# Patient Record
Sex: Male | Born: 1955
Health system: Southern US, Community
[De-identification: ages and names within clinical notes are randomized; demographics above are authoritative.]

## PROBLEM LIST (undated history)

## (undated) DIAGNOSIS — F528 Other sexual dysfunction not due to a substance or known physiological condition: Secondary | ICD-10-CM

## (undated) DIAGNOSIS — E119 Type 2 diabetes mellitus without complications: Secondary | ICD-10-CM

## (undated) DIAGNOSIS — F909 Attention-deficit hyperactivity disorder, unspecified type: Secondary | ICD-10-CM

## (undated) DIAGNOSIS — M51379 Other intervertebral disc degeneration, lumbosacral region without mention of lumbar back pain or lower extremity pain: Secondary | ICD-10-CM

## (undated) DIAGNOSIS — I10 Essential (primary) hypertension: Secondary | ICD-10-CM

## (undated) DIAGNOSIS — E785 Hyperlipidemia, unspecified: Secondary | ICD-10-CM

## (undated) DIAGNOSIS — S82899A Other fracture of unspecified lower leg, initial encounter for closed fracture: Secondary | ICD-10-CM

## (undated) DIAGNOSIS — M5137 Other intervertebral disc degeneration, lumbosacral region: Secondary | ICD-10-CM

## (undated) HISTORY — DX: Attention-deficit hyperactivity disorder, unspecified type: F90.9

## (undated) HISTORY — DX: Type 2 diabetes mellitus without complications: E11.9

## (undated) HISTORY — DX: Other fracture of unspecified lower leg, initial encounter for closed fracture: S82.899A

## (undated) HISTORY — DX: Essential (primary) hypertension: I10

## (undated) HISTORY — DX: Other intervertebral disc degeneration, lumbosacral region without mention of lumbar back pain or lower extremity pain: M51.379

## (undated) HISTORY — DX: Hyperlipidemia, unspecified: E78.5

## (undated) HISTORY — DX: Other sexual dysfunction not due to a substance or known physiological condition: F52.8

## (undated) HISTORY — DX: Other intervertebral disc degeneration, lumbosacral region: M51.37

## (undated) HISTORY — PX: OTHER SURGICAL HISTORY: SHX169

---

## 2004-11-15 ENCOUNTER — Ambulatory Visit: Payer: Self-pay | Admitting: Internal Medicine

## 2004-11-22 ENCOUNTER — Ambulatory Visit: Payer: Self-pay | Admitting: Internal Medicine

## 2005-05-23 ENCOUNTER — Ambulatory Visit: Payer: Self-pay | Admitting: Internal Medicine

## 2005-07-02 ENCOUNTER — Emergency Department (HOSPITAL_COMMUNITY): Admission: EM | Admit: 2005-07-02 | Discharge: 2005-07-02 | Payer: Self-pay | Admitting: Emergency Medicine

## 2005-08-03 ENCOUNTER — Ambulatory Visit: Payer: Self-pay | Admitting: Internal Medicine

## 2006-03-15 ENCOUNTER — Ambulatory Visit: Payer: Self-pay | Admitting: Internal Medicine

## 2006-10-04 ENCOUNTER — Encounter: Payer: Self-pay | Admitting: Internal Medicine

## 2006-10-04 DIAGNOSIS — T7840XA Allergy, unspecified, initial encounter: Secondary | ICD-10-CM | POA: Insufficient documentation

## 2006-10-04 DIAGNOSIS — S82899A Other fracture of unspecified lower leg, initial encounter for closed fracture: Secondary | ICD-10-CM | POA: Insufficient documentation

## 2006-10-04 DIAGNOSIS — M5137 Other intervertebral disc degeneration, lumbosacral region: Secondary | ICD-10-CM | POA: Insufficient documentation

## 2006-12-14 ENCOUNTER — Ambulatory Visit: Payer: Self-pay | Admitting: Internal Medicine

## 2006-12-14 LAB — CONVERTED CEMR LAB
ALT: 30 units/L (ref 0–53)
Albumin: 4.2 g/dL (ref 3.5–5.2)
Alkaline Phosphatase: 68 units/L (ref 39–117)
Chloride: 102 meq/L (ref 96–112)
Cholesterol: 272 mg/dL (ref 0–200)
Creatinine, Ser: 1.2 mg/dL (ref 0.4–1.5)
Eosinophils Absolute: 0.1 10*3/uL (ref 0.0–0.6)
Eosinophils Relative: 1.2 % (ref 0.0–5.0)
GFR calc Af Amer: 83 mL/min
Glucose, Bld: 114 mg/dL — ABNORMAL HIGH (ref 70–99)
HCT: 42.4 % (ref 39.0–52.0)
HDL: 41.2 mg/dL (ref >39.0)
Hemoglobin, Urine: NEGATIVE
Ketones, ur: NEGATIVE mg/dL
Leukocytes, UA: NEGATIVE
MCHC: 35.3 g/dL (ref 30.0–36.0)
Monocytes Relative: 5.6 % (ref 3.0–11.0)
Neutrophils Relative %: 71.3 % (ref 43.0–77.0)
PSA: 0.88 ng/mL (ref 0.10–4.00)
Platelets: 175 10*3/uL (ref 150–400)
Potassium: 4.8 meq/L (ref 3.5–5.1)
Sodium: 139 meq/L (ref 135–145)
Total Bilirubin: 1.6 mg/dL — ABNORMAL HIGH (ref 0.3–1.2)
Total CHOL/HDL Ratio: 6.6
Total Protein: 7.5 g/dL (ref 6.0–8.3)
Triglycerides: 173 mg/dL — ABNORMAL HIGH (ref 0–149)
Urine Glucose: NEGATIVE mg/dL
VLDL: 35 mg/dL (ref 0–40)
WBC: 8.2 10*3/uL (ref 4.5–10.5)

## 2006-12-19 ENCOUNTER — Ambulatory Visit: Payer: Self-pay | Admitting: Internal Medicine

## 2006-12-19 DIAGNOSIS — E785 Hyperlipidemia, unspecified: Secondary | ICD-10-CM | POA: Insufficient documentation

## 2006-12-19 DIAGNOSIS — F909 Attention-deficit hyperactivity disorder, unspecified type: Secondary | ICD-10-CM | POA: Insufficient documentation

## 2006-12-19 DIAGNOSIS — F528 Other sexual dysfunction not due to a substance or known physiological condition: Secondary | ICD-10-CM | POA: Insufficient documentation

## 2007-12-16 ENCOUNTER — Ambulatory Visit: Payer: Self-pay | Admitting: Internal Medicine

## 2007-12-17 LAB — CONVERTED CEMR LAB
Albumin: 4.1 g/dL (ref 3.5–5.2)
Alkaline Phosphatase: 71 units/L (ref 39–117)
Basophils Absolute: 0 10*3/uL (ref 0.0–0.1)
Basophils Relative: 0.3 % (ref 0.0–3.0)
Calcium: 9.2 mg/dL (ref 8.4–10.5)
Eosinophils Absolute: 0.1 10*3/uL (ref 0.0–0.7)
GFR calc non Af Amer: 75 mL/min
Hemoglobin: 14.2 g/dL (ref 13.0–17.0)
Hgb A1c MFr Bld: 5.7 % (ref 4.6–6.0)
Ketones, ur: NEGATIVE mg/dL
Leukocytes, UA: NEGATIVE
Lymphocytes Relative: 20.7 % (ref 12.0–46.0)
MCHC: 34.8 g/dL (ref 30.0–36.0)
MCV: 86.3 fL (ref 78.0–100.0)
Monocytes Absolute: 0.4 10*3/uL (ref 0.1–1.0)
Monocytes Relative: 5.1 % (ref 3.0–12.0)
Neutrophils Relative %: 72.3 % (ref 43.0–77.0)
Platelets: 141 10*3/uL — ABNORMAL LOW (ref 150–400)
Sodium: 143 meq/L (ref 135–145)
TSH: 1.5 microintl units/mL (ref 0.35–5.50)
Total Bilirubin: 1.8 mg/dL — ABNORMAL HIGH (ref 0.3–1.2)
Total CHOL/HDL Ratio: 2.9
Triglycerides: 100 mg/dL (ref 0–149)
Urine Glucose: NEGATIVE mg/dL
pH: 6 (ref 5.0–8.0)

## 2007-12-23 ENCOUNTER — Ambulatory Visit: Payer: Self-pay | Admitting: Internal Medicine

## 2008-02-26 ENCOUNTER — Ambulatory Visit: Payer: Self-pay | Admitting: Internal Medicine

## 2008-03-09 ENCOUNTER — Ambulatory Visit: Payer: Self-pay | Admitting: Gastroenterology

## 2008-03-09 LAB — HM COLONOSCOPY

## 2008-06-01 ENCOUNTER — Ambulatory Visit: Payer: Self-pay | Admitting: Internal Medicine

## 2008-06-01 DIAGNOSIS — R3915 Urgency of urination: Secondary | ICD-10-CM | POA: Insufficient documentation

## 2008-06-01 DIAGNOSIS — K645 Perianal venous thrombosis: Secondary | ICD-10-CM | POA: Insufficient documentation

## 2008-06-01 LAB — CONVERTED CEMR LAB
Bilirubin Urine: NEGATIVE
Leukocytes, UA: NEGATIVE
Nitrite: NEGATIVE
pH: 5.5 (ref 5.0–8.0)

## 2008-06-16 ENCOUNTER — Telehealth: Payer: Self-pay | Admitting: Gastroenterology

## 2008-10-05 ENCOUNTER — Ambulatory Visit: Payer: Self-pay | Admitting: Internal Medicine

## 2008-10-05 DIAGNOSIS — M549 Dorsalgia, unspecified: Secondary | ICD-10-CM | POA: Insufficient documentation

## 2008-10-05 DIAGNOSIS — G4710 Hypersomnia, unspecified: Secondary | ICD-10-CM | POA: Insufficient documentation

## 2008-10-29 ENCOUNTER — Ambulatory Visit: Payer: Self-pay | Admitting: Pulmonary Disease

## 2008-11-25 ENCOUNTER — Ambulatory Visit (HOSPITAL_BASED_OUTPATIENT_CLINIC_OR_DEPARTMENT_OTHER): Admission: RE | Admit: 2008-11-25 | Discharge: 2008-11-25 | Payer: Self-pay | Admitting: Pulmonary Disease

## 2008-11-27 ENCOUNTER — Ambulatory Visit: Payer: Self-pay | Admitting: Pulmonary Disease

## 2008-12-05 ENCOUNTER — Encounter: Payer: Self-pay | Admitting: Pulmonary Disease

## 2009-03-04 ENCOUNTER — Ambulatory Visit: Payer: Self-pay | Admitting: Internal Medicine

## 2009-03-04 LAB — CONVERTED CEMR LAB
ALT: 33 units/L (ref 0–53)
Albumin: 4.3 g/dL (ref 3.5–5.2)
Alkaline Phosphatase: 93 units/L (ref 39–117)
BUN: 7 mg/dL (ref 6–23)
Basophils Absolute: 0 10*3/uL (ref 0.0–0.1)
Calcium: 9.3 mg/dL (ref 8.4–10.5)
Creatinine, Ser: 1.1 mg/dL (ref 0.4–1.5)
GFR calc non Af Amer: 90.65 mL/min (ref >60)
Hemoglobin, Urine: NEGATIVE
Hemoglobin: 13.9 g/dL (ref 13.0–17.0)
Hgb A1c MFr Bld: 7 % — ABNORMAL HIGH (ref 4.6–6.5)
LDL Cholesterol: 118 mg/dL — ABNORMAL HIGH (ref 0–99)
Lymphs Abs: 1.5 10*3/uL (ref 0.7–4.0)
Monocytes Absolute: 0.4 10*3/uL (ref 0.1–1.0)
Monocytes Relative: 5.8 % (ref 3.0–12.0)
Nitrite: NEGATIVE
PSA: 0.81 ng/mL (ref 0.10–4.00)
Platelets: 145 10*3/uL — ABNORMAL LOW (ref 150.0–400.0)
RBC: 4.83 M/uL (ref 4.22–5.81)
Specific Gravity, Urine: 1.02 (ref 1.000–1.030)
TSH: 1.49 microintl units/mL (ref 0.35–5.50)
Total Bilirubin: 1.4 mg/dL — ABNORMAL HIGH (ref 0.3–1.2)
Total Protein: 7.4 g/dL (ref 6.0–8.3)
Triglycerides: 143 mg/dL (ref 0.0–149.0)
WBC: 7 10*3/uL (ref 4.5–10.5)
pH: 5.5 (ref 5.0–8.0)

## 2009-03-10 ENCOUNTER — Ambulatory Visit: Payer: Self-pay | Admitting: Internal Medicine

## 2009-07-22 ENCOUNTER — Ambulatory Visit: Payer: Self-pay | Admitting: Internal Medicine

## 2009-07-22 DIAGNOSIS — R21 Rash and other nonspecific skin eruption: Secondary | ICD-10-CM | POA: Insufficient documentation

## 2010-02-15 NOTE — Assessment & Plan Note (Signed)
Summary: CPX / [employer] / # / CD   Vital Signs:  Patient profile:   55 year old male Height:      69 inches Weight:      201 pounds BMI:     29.79 O2 Sat:      98 % on Room air Temp:     97.6 degrees F oral Pulse rate:   94 / minute BP sitting:   126 / 90  (left arm) Cuff size:   large  Vitals Entered ByMarland Kitchen Zella Ball Ewing (March 10, 2009 1:20 PM)  O2 Flow:  Room air  CC: Adult Physical/RE   CC:  Adult Physical/RE.  History of Present Illness: overall doing well, no complaints;  Pt denies CP, sob, doe, wheezing, orthopnea, pnd, worsening LE edema, palps, dizziness or syncope   Pt denies new neuro symptoms such as headache, facial or extremity weakness   Overall excellent med compliance, and good med tolerance.  No other new complaints  Problems Prior to Update: 1)  Preventive Health Care  (ICD-V70.0) 2)  Hypersomnia  (ICD-780.54) 3)  Back Pain  (ICD-724.5) 4)  Urinary Urgency  (ICD-788.63) 5)  External Hemorrhoids, Thrombosed  (ICD-455.4) 6)  Preventive Health Care  (ICD-V70.0) 7)  Preventive Health Care  (ICD-V70.0) 8)  Erectile Dysfunction  (ICD-302.72) 9)  Hyperlipidemia  (ICD-272.4) 10)  Adhd  (ICD-314.01) 11)  Observation For Suspected Malignant Neoplasm  (ICD-V71.1) 12)  Routine General Medical Exam@health  Care Facl  (ICD-V70.0) 13)  Fracture, Right Leg  (ICD-827.0) 14)  Disc Disease, Lumbar  (ICD-722.52) 15)  Allergy  (ICD-995.3)  Medications Prior to Update: 1)  Crestor 40 Mg Tabs (Rosuvastatin Calcium) .Marland Kitchen.. 1 By Mouth Once Daily 2)  Ritalin 20 Mg Tabs (Methylphenidate Hcl) .Marland Kitchen.. 1 By Mouth Two Times A Day; Every Morning and At Noon 3)  Depakote Er 250 Mg  Tb24 (Divalproex Sodium) .Marland Kitchen.. 1 By Mouth Qhs 4)  Ecotrin Low Strength 81 Mg  Tbec (Aspirin) .Marland Kitchen.. 1 By Mouth Qd 5)  Viagra 100 Mg  Tabs (Sildenafil Citrate) .Marland Kitchen.. 1 By Mouth Once Daily Prn 6)  Tramadol Hcl 50 Mg Tabs (Tramadol Hcl) .Marland Kitchen.. 1 - 2 By Mouth Q 6 Hrs As Needed 7)  Flexeril 5 Mg Tabs (Cyclobenzaprine Hcl)  .Marland Kitchen.. 1 By Mouth Three Times A Day As Needed  Current Medications (verified): 1)  Crestor 40 Mg Tabs (Rosuvastatin Calcium) .Marland Kitchen.. 1 By Mouth Once Daily 2)  Ritalin 20 Mg Tabs (Methylphenidate Hcl) .Marland Kitchen.. 1 By Mouth Two Times A Day; Every Morning and At Noon 3)  Depakote Er 250 Mg  Tb24 (Divalproex Sodium) .Marland Kitchen.. 1 By Mouth Qhs 4)  Ecotrin Low Strength 81 Mg  Tbec (Aspirin) .Marland Kitchen.. 1 By Mouth Qd 5)  Viagra 100 Mg  Tabs (Sildenafil Citrate) .Marland Kitchen.. 1 By Mouth Once Daily Prn  Allergies (verified): No Known Drug Allergies  Past History:  Past Medical History: Last updated: 12/19/2006 DISC DISEASE, LUMBAR (ICD-722.52) ALLERGY (ICD-995.3) ADHD Hyperlipidemia E.D.  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         all otherwise negative per pt  Physical Exam  General:  alert and overweight-appearing.   Head:  normocephalic and atraumatic.   Eyes:  vision grossly intact, pupils equal, and pupils round.   Ears:  R ear normal and L ear normal.   Nose:  no external deformity and no nasal discharge.   Mouth:  no gingival abnormalities and  pharynx pink and moist.   Neck:  supple and no masses.   Lungs:  normal respiratory effort and normal breath sounds.   Heart:  normal rate and regular rhythm.   Abdomen:  soft, non-tender, and normal bowel sounds.   Msk:  no joint tenderness and no joint swelling.   Extremities:  no edema, no erythema  Neurologic:  cranial nerves II-XII intact and strength normal in all extremities.     Impression & Recommendations:  Problem # 1:  Preventive Health Care (ICD-V70.0)  Overall doing well, age appropriate education and counseling updated and referral for appropriate preventive services done unless declined, immunizations up to date or declined, diet counseling done if overweight, urged to quit smoking if smokes , most recent labs reviewed and current ordered if appropriate, ecg reviewed or declined (interpretation per ECG scanned in the EMR if done); information regarding Medicare Prevention requirements given if appropriate   Orders: EKG w/ Interpretation (93000)  Complete Medication List: 1)  Crestor 40 Mg Tabs (Rosuvastatin calcium) .Marland Kitchen.. 1 by mouth once daily 2)  Ritalin 20 Mg Tabs (Methylphenidate hcl) .Marland Kitchen.. 1 by mouth two times a day; every morning and at noon 3)  Depakote Er 250 Mg Tb24 (Divalproex sodium) .Marland Kitchen.. 1 by mouth qhs 4)  Ecotrin Low Strength 81 Mg Tbec (Aspirin) .Marland Kitchen.. 1 by mouth qd 5)  Viagra 100 Mg Tabs (Sildenafil citrate) .Marland Kitchen.. 1 by mouth once daily prn  Other Orders: Admin 1st Vaccine (98119) Flu Vaccine 25yrs + (14782)  Patient Instructions: 1)  Take an Aspirin every day - 81 mg - 1 per day - COATED only  2)  please re-start the crestor 3)  Continue all previous medications as before this visit  4)  Please schedule a follow-up appointment in 1 year or sooner if needed Prescriptions: CRESTOR 40 MG TABS (ROSUVASTATIN CALCIUM) 1 by mouth once daily  #90 x 3   Entered and Authorized by:   Corwin Levins MD   Signed by:   Corwin Levins MD on 03/10/2009   Method used:    Electronically to        [facility]  [phone]* (retail)       [address]       [place], [place]  [address]       Ph: [phone] or [phone]       Fax: [phone]   RxID:   [id number]     Flu Vaccine Consent Questions     Do you have a history of severe allergic reactions to this vaccine? no    Any prior history of allergic reactions to egg and/or gelatin? no    Do you have a sensitivity to the preservative Thimersol? no    Do you have a past history of Guillan-Barre Syndrome? no  Do you currently have an acute febrile illness? no    Have you ever had a severe reaction to latex? no    Vaccine information given and explained to patient? yes    Are you currently pregnant? no    Lot Number:[id number]   Exp Date:07/15/2009   Site Given  Left Deltoid IMflu

## 2010-02-15 NOTE — Assessment & Plan Note (Signed)
Summary: RASH/NWS   Vital Signs:  Patient profile:   55 year old male Height:      69 inches Weight:      198.25 pounds BMI:     29.38 O2 Sat:      95 % on Room air Temp:     98.4 degrees F oral Pulse rate:   79 / minute BP sitting:   110 / 82  (left arm) Cuff size:   large  Vitals Entered By: Zella Ball Ewing CMA Duncan Dull) (July 22, 2009 2:59 PM)  O2 Flow:  Room air  CC: Rash/RE   CC:  Rash/RE.  History of Present Illness: here with acute onset pruritic rash seemingly to spread very quickly form the arms to the upper chest and back, after contact exposure to duaghter with similar rash last evening.  No fever, night sweats, and Pt denies CP, sob, doe, wheezing, orthopnea, pnd, worsening LE edema, palps, dizziness or syncope  Pt denies new neuro symptoms such as headache, facial or extremity weakness   Problems Prior to Update: 1)  Rash-nonvesicular  (ICD-782.1) 2)  Preventive Health Care  (ICD-V70.0) 3)  Hypersomnia  (ICD-780.54) 4)  Back Pain  (ICD-724.5) 5)  Urinary Urgency  (ICD-788.63) 6)  External Hemorrhoids, Thrombosed  (ICD-455.4) 7)  Preventive Health Care  (ICD-V70.0) 8)  Preventive Health Care  (ICD-V70.0) 9)  Erectile Dysfunction  (ICD-302.72) 10)  Hyperlipidemia  (ICD-272.4) 11)  Adhd  (ICD-314.01) 12)  Observation For Suspected Malignant Neoplasm  (ICD-V71.1) 13)  Routine General Medical Exam@health  Care Facl  (ICD-V70.0) 14)  Fracture, Right Leg  (ICD-827.0) 15)  Disc Disease, Lumbar  (ICD-722.52) 16)  Allergy  (ICD-995.3)  Medications Prior to Update: 1)  Crestor 40 Mg Tabs (Rosuvastatin Calcium) .Marland Kitchen.. 1 By Mouth Once Daily 2)  Ritalin 20 Mg Tabs (Methylphenidate Hcl) .Marland Kitchen.. 1 By Mouth Two Times A Day; Every Morning and At Noon 3)  Depakote Er 250 Mg  Tb24 (Divalproex Sodium) .Marland Kitchen.. 1 By Mouth Qhs 4)  Ecotrin Low Strength 81 Mg  Tbec (Aspirin) .Marland Kitchen.. 1 By Mouth Qd 5)  Viagra 100 Mg  Tabs (Sildenafil Citrate) .Marland Kitchen.. 1 By Mouth Once Daily Prn  Current Medications  (verified): 1)  Crestor 40 Mg Tabs (Rosuvastatin Calcium) .Marland Kitchen.. 1 By Mouth Once Daily 2)  Ritalin 20 Mg Tabs (Methylphenidate Hcl) .Marland Kitchen.. 1 By Mouth Two Times A Day; Every Morning and At Noon 3)  Depakote Er 250 Mg  Tb24 (Divalproex Sodium) .Marland Kitchen.. 1 By Mouth Qhs 4)  Ecotrin Low Strength 81 Mg  Tbec (Aspirin) .Marland Kitchen.. 1 By Mouth Qd 5)  Viagra 100 Mg  Tabs (Sildenafil Citrate) .Marland Kitchen.. 1 By Mouth Once Daily Prn 6)  Prednisone 10 Mg Tabs (Prednisone) .... 3po Qd For 3days, Then 2po Qd For 3days, Then 1po Qd For 3days, Then Stop 7)  Triamcinolone Acetonide 0.1 % Crea (Triamcinolone Acetonide) .... Use Asd Two Times A Day As Needed  Allergies (verified): No Known Drug Allergies  Past History:  Past Medical History: Last updated: 12/19/2006 DISC DISEASE, LUMBAR (ICD-722.52) ALLERGY (ICD-995.3) ADHD Hyperlipidemia E.D.  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   Head:  normocephalic and atraumatic.   Eyes:  vision grossly intact, pupils equal, and pupils round.   Ears:  R ear normal and L ear normal.   Nose:  no external deformity and no nasal discharge.   Mouth:  no gingival abnormalities and pharynx pink and moist.   Neck:  supple and no masses.   Lungs:  normal respiratory effort and normal breath sounds.   Heart:  normal rate and regular rhythm.   Extremities:  no edema, no erythema  Skin:  muliple areas to arms, upper back and chest with rashc/w contact derm   Problems:  Medical Problems Added: 1)  Dx of Rash-nonvesicular  (ICD-782.1)  Impression & Recommendations:  Problem # 1:   RASH-NONVESICULAR (ICD-782.1)  His updated medication list for this problem includes:    Triamcinolone Acetonide 0.1 % Crea (Triamcinolone acetonide) ..... Use asd two times a day as needed  Orders: Depo- Medrol 40mg  (J1030) Depo- Medrol 80mg  (J1040) Admin of Therapeutic Inj  intramuscular or subcutaneous (64403) c/w contact dermatitis - tx with depo IM todya, prednisone pack and treat as above, f/u any worsening signs or symptoms   Complete Medication List: 1)  Crestor 40 Mg Tabs (Rosuvastatin calcium) .Marland Kitchen.. 1 by mouth once daily 2)  Ritalin 20 Mg Tabs (Methylphenidate hcl) .Marland Kitchen.. 1 by mouth two times a day; every morning and at noon 3)  Depakote Er 250 Mg Tb24 (Divalproex sodium) .Marland Kitchen.. 1 by mouth qhs 4)  Ecotrin Low Strength 81 Mg Tbec (Aspirin) .Marland Kitchen.. 1 by mouth qd 5)  Viagra 100 Mg Tabs (Sildenafil citrate) .Marland Kitchen.. 1 by mouth once daily prn 6)  Prednisone 10 Mg Tabs (Prednisone) .... 3po qd for 3days, then 2po qd for 3days, then 1po qd for 3days, then stop 7)  Triamcinolone Acetonide 0.1 % Crea (Triamcinolone acetonide) .... Use asd two times a day as needed  Patient Instructions: 1)  you had the steroid shot today 2)  Please take all new medications as prescribed - the prednsone and cream 3)  Continue all previous medications as before this visit  4)  Please schedule a follow-up appointment in Feb 2012 with CPX labs Prescriptions: TRIAMCINOLONE ACETONIDE 0.1 % CREA (TRIAMCINOLONE ACETONIDE) use asd two times a day as needed  #1 x 1   Entered and Authorized by:   Corwin Levins MD   Signed by:   Corwin Levins MD on 07/22/2009   Method used:   Print then Give to Patient   RxID:   [id number] PREDNISONE 10 MG TABS (PREDNISONE) 3po qd for 3days, then 2po qd for 3days, then 1po qd for 3days, then stop  #18 x 0   Entered and Authorized by:   Corwin Levins MD   Signed by:   Corwin Levins MD on 07/22/2009   Method used:   Print then Give to Patient   RxID:   [id number]    Medication  Administration  Injection # 1:    Medication: Depo- Medrol 40mg     Diagnosis: RASH-NONVESICULAR (ICD-782.1)    Route: IM    Site: LUOQ gluteus    Exp Date: 04/2012    Lot #: [id number]    Mfr: Pharmacia    Given by: Zella Ball Ewing CMA (AAMA) (July 22, 2009 4:08 PM)  Injection # 2:    Medication: Depo- Medrol 80mg     Diagnosis: RASH-NONVESICULAR (ICD-782.1)    Route: IM    Site: LUOQ gluteus  Exp Date: 04/2012    Lot #: [id number]    Mfr: Pharmacia    Given by: Zella Ball Ewing CMA Duncan Dull) (July 22, 2009 4:08 PM)  Orders Added: 1)  Depo- Medrol 40mg  [J1030] 2)  Depo- Medrol 80mg  [J1040] 3)  Admin of Therapeutic Inj  intramuscular or subcutaneous [96372] 4)  Est. Patient Level III [52841]

## 2010-03-18 ENCOUNTER — Encounter: Payer: Self-pay | Admitting: Internal Medicine

## 2010-03-18 ENCOUNTER — Ambulatory Visit (INDEPENDENT_AMBULATORY_CARE_PROVIDER_SITE_OTHER): Payer: BC Managed Care – PPO | Admitting: Internal Medicine

## 2010-03-18 ENCOUNTER — Other Ambulatory Visit: Payer: Self-pay | Admitting: Internal Medicine

## 2010-03-18 DIAGNOSIS — J019 Acute sinusitis, unspecified: Secondary | ICD-10-CM | POA: Insufficient documentation

## 2010-03-18 DIAGNOSIS — F528 Other sexual dysfunction not due to a substance or known physiological condition: Secondary | ICD-10-CM

## 2010-03-18 DIAGNOSIS — T7840XA Allergy, unspecified, initial encounter: Secondary | ICD-10-CM

## 2010-03-21 ENCOUNTER — Ambulatory Visit (INDEPENDENT_AMBULATORY_CARE_PROVIDER_SITE_OTHER)
Admission: RE | Admit: 2010-03-21 | Discharge: 2010-03-21 | Disposition: A | Discharge status: Home / Self Care | Payer: BC Managed Care – PPO | Source: Ambulatory Visit | Attending: Internal Medicine | Admitting: Internal Medicine

## 2010-03-21 DIAGNOSIS — J019 Acute sinusitis, unspecified: Secondary | ICD-10-CM

## 2010-03-24 NOTE — Assessment & Plan Note (Signed)
Summary: pain and swelling in face/difficulty talking/pain started in ...   Vital Signs:  Patient profile:   55 year old male Height:      69 inches Weight:      195.38 pounds BMI:     28.96 O2 Sat:      97 % on Room air Temp:     98.8 degrees F oral Pulse rate:   73 / minute BP sitting:   152 / 100  (left arm) Cuff size:   large  Vitals Entered By: Zella Ball Ewing CMA Duncan Dull) (March 18, 2010 1:04 PM)  O2 Flow:  Room air CC: Sharp left ear and jaw pain, swelling, cough and sneezing/RE   CC:  Sharp left ear and jaw pain, swelling, and cough and sneezing/RE.  History of Present Illness: here with wife, c/o 3 days radialy worsening left ear pain and fullness, and left facial pain/sweling with  pressure, fever and greenish d/c with moderate HA and ST,  but no cough and Pt denies CP, worsening sob, doe, wheezing, orthopnea, pnd, worsening LE edema, palps, dizziness or syncope  Pt denies new neuro symptoms such as facial or extremity weakness   Pt denies polydipsia, polyuria  Overall good compliance with meds, trying to follow low chol  diet, wt stable, little excercise however  .  Ibuprofen 800 mg, trmadol, anbesol and alleve no help.  Nothing makes worse except lying on left side at night.  No prior hx of this severity, but has had ongoing allergic nasal symtpoms for several months without pain or colored d/c.  Overall good compliance with meds, and good tolerability.   Still with ED symptoms ongoing , no worse but viagra works well.    Problems Prior to Update: 1)  Sinusitis, Acute  (ICD-461.9) 2)  Rash-nonvesicular  (ICD-782.1) 3)  Preventive Health Care  (ICD-V70.0) 4)  Hypersomnia  (ICD-780.54) 5)  Back Pain  (ICD-724.5) 6)  Urinary Urgency  (ICD-788.63) 7)  External Hemorrhoids, Thrombosed  (ICD-455.4) 8)  Preventive Health Care  (ICD-V70.0) 9)  Preventive Health Care  (ICD-V70.0) 10)  Erectile Dysfunction  (ICD-302.72) 11)  Hyperlipidemia  (ICD-272.4) 12)  Adhd  (ICD-314.01) 13)   Observation For Suspected Malignant Neoplasm  (ICD-V71.1) 14)  Routine General Medical Exam@health  Care Facl  (ICD-V70.0) 15)  Fracture, Right Leg  (ICD-827.0) 16)  Disc Disease, Lumbar  (ICD-722.52) 17)  Allergy  (ICD-995.3)  Medications Prior to Update: 1)  Crestor 40 Mg Tabs (Rosuvastatin Calcium) .Marland Kitchen.. 1 By Mouth Once Daily 2)  Ritalin 20 Mg Tabs (Methylphenidate Hcl) .Marland Kitchen.. 1 By Mouth Two Times A Day; Every Morning and At Noon 3)  Depakote Er 250 Mg  Tb24 (Divalproex Sodium) .Marland Kitchen.. 1 By Mouth Qhs 4)  Ecotrin Low Strength 81 Mg  Tbec (Aspirin) .Marland Kitchen.. 1 By Mouth Qd 5)  Viagra 100 Mg  Tabs (Sildenafil Citrate) .Marland Kitchen.. 1 By Mouth Once Daily Prn 6)  Prednisone 10 Mg Tabs (Prednisone) .... 3po Qd For 3days, Then 2po Qd For 3days, Then 1po Qd For 3days, Then Stop 7)  Triamcinolone Acetonide 0.1 % Crea (Triamcinolone Acetonide) .... Use Asd Two Times A Day As Needed  Current Medications (verified): 1)  Crestor 40 Mg Tabs (Rosuvastatin Calcium) .Marland Kitchen.. 1 By Mouth Once Daily 2)  Ritalin 20 Mg Tabs (Methylphenidate Hcl) .Marland Kitchen.. 1 By Mouth Two Times A Day; Every Morning and At Noon 3)  Depakote Er 250 Mg  Tb24 (Divalproex Sodium) .Marland Kitchen.. 1 By Mouth Qhs 4)  Ecotrin Low Strength 81 Mg  Tbec (Aspirin) .Marland Kitchen.. 1 By Mouth Qd 5)  Viagra 100 Mg  Tabs (Sildenafil Citrate) .Marland Kitchen.. 1 By Mouth Once Daily Prn 6)  Amoxicillin-Pot Clavulanate 875-125 Mg Tabs (Amoxicillin-Pot Clavulanate) .Marland Kitchen.. 1 By Mouth Two Times A Day 7)  Oxycodone Hcl 5 Mg Tabs (Oxycodone Hcl) .Marland Kitchen.. 1 By Mouth Q 6 Hrs As Needed Pain  Allergies (verified): No Known Drug Allergies  Past History:  Past Medical History: Last updated: 12/19/2006 DISC DISEASE, LUMBAR (ICD-722.52) ALLERGY (ICD-995.3) ADHD Hyperlipidemia E.D.  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   Eyes:  vision grossly intact, pupils equal, and pupils round.   Ears:  left tm moderate erythema, right tm ok, canals clear, left facial and maxillary sinus area severe tender, swelling Nose:  nasal dischargemucosal pallor.   Mouth:  no gingival abnormalities and pharynx pink and moist.   Neck:  supple and cervical lymphadenopathy on left submandibular only Lungs:  normal respiratory effort and normal breath sounds.   Heart:  normal rate and regular rhythm.   Extremities:  no edema, no erythema    Impression & Recommendations:  Problem # 1:  SINUSITIS, ACUTE (ICD-461.9)  unusual case , left maxillary only with severe swelling and severe pain;  for demerol/phenergan 25/25 mg IM today, rocephin IM, augmentin for home and pain med,  CT sinus today, and consider ENT evaluation - ? left max sinus obstruction  His updated medication list for this problem includes:    Amoxicillin-pot Clavulanate 875-125 Mg Tabs (Amoxicillin-pot clavulanate) .Marland Kitchen... 1 by mouth two times a day  Orders: Rocephin  250mg  (Z6109) Demerol / Phenergan Injection (U0454) Admin of Therapeutic Inj  intramuscular or subcutaneous (09811) Admin of Therapeutic Inj  intramuscular or subcutaneous (91478) Radiology Referral (Radiology)  Problem # 2:  ALLERGY (ICD-995.3) treat as above, f/u any worsening signs or symptoms  - after to start OTC allegra as needed   Problem # 3:  ERECTILE DYSFUNCTION (ICD-302.72)  His updated medication list for this problem includes:    Viagra 100 Mg Tabs (Sildenafil citrate) .Marland Kitchen... 1 by mouth once daily prn stable overall by hx and exam, ok to continue meds/tx as is  - refills today  Complete Medication List: 1)  Crestor 40 Mg Tabs (Rosuvastatin  calcium) .Marland Kitchen.. 1 by mouth once daily 2)  Ritalin 20 Mg Tabs (Methylphenidate hcl) .Marland Kitchen.. 1 by mouth two times a day; every morning and at noon 3)  Depakote Er 250 Mg Tb24 (Divalproex sodium) .Marland Kitchen.. 1 by mouth qhs 4)  Ecotrin Low Strength 81 Mg Tbec (Aspirin) .Marland Kitchen.. 1 by mouth qd 5)  Viagra 100 Mg Tabs (Sildenafil citrate) .Marland Kitchen.. 1 by mouth once daily prn 6)  Amoxicillin-pot Clavulanate 875-125 Mg Tabs (Amoxicillin-pot clavulanate) .Marland Kitchen.. 1 by mouth two times a day 7)  Oxycodone Hcl 5 Mg Tabs (Oxycodone hcl) .Marland Kitchen.. 1 by mouth q 6 hrs as needed pain  Patient Instructions: 1)  you had the pain shot (demerol/phenergan 25/25 mg) today 2)  you had the antibiotic shot today (rocephin) 3)  Please take all new medications  as prescribed - the antibiotic and pain med for home 4)  You can also use Allegra OTC or it's generic for congestion and allergies  5)  Please see the PCC's now to hopefully get the sinus CT today 6)  You may need to see ENT depending on the CT results 7)  Continue all previous medications as before this visit  8)  Please schedule a follow-up appointment in 1 month for CPX with labs and: 9)  depakote level:  314.01 Prescriptions: OXYCODONE HCL 5 MG TABS (OXYCODONE HCL) 1 by mouth q 6 hrs as needed pain  #30 x 0   Entered and Authorized by:   Corwin Levins MD   Signed by:   Corwin Levins MD on 03/18/2010   Method used:   Print then Give to Patient   RxID:   [id number] AMOXICILLIN-POT CLAVULANATE 875-125 MG TABS (AMOXICILLIN-POT CLAVULANATE) 1 by mouth two times a day  #20 x 0   Entered and Authorized by:   Corwin Levins MD   Signed by:   Corwin Levins MD on 03/18/2010   Method used:   Print then Give to Patient   RxID:   [id number]    Medication Administration  Injection # 1:    Medication: Rocephin  250mg     Diagnosis: SINUSITIS, ACUTE (ICD-461.9)    Route: IM    Site: RUOQ gluteus    Exp Date: 02/2013    Lot #: [id number]    Mfr: NovaPlus    Comments: Patient received  Rocephin 1 gm    Patient tolerated injection without complications    Given by: Zella Ball Ewing CMA (AAMA) (March 18, 2010 2:02 PM)  Injection # 2:    Medication: Demerol / Phenergan Injection    Diagnosis: SINUSITIS, ACUTE (ICD-461.9)    Route: IM    Site: LUOQ gluteus    Exp Date: 11/17/2010    Lot #: [id number]    Mfr: Hospira    Comments: Patient received Demerol 25mg   (Demerol 50mg  vial used, only injected 25mg , discarded the other half) and Promethazine 25mg , Lot #[id number], exp. 06/2011, Mfg. NovaPlus.    Patient tolerated injection without complications    Given by: Zella Ball Ewing CMA Duncan Dull) (March 18, 2010 2:07 PM)  Orders Added: 1)  Rocephin  250mg  [J0696] 2)  Demerol / Phenergan Injection [J2180] 3)  Admin of Therapeutic Inj  intramuscular or subcutaneous [96372] 4)  Admin of Therapeutic Inj  intramuscular or subcutaneous [96372] 5)  Radiology Referral [Radiology] 6)  Est. Patient Level IV [32440]

## 2010-04-07 ENCOUNTER — Other Ambulatory Visit: Payer: Self-pay | Admitting: Internal Medicine

## 2010-04-08 ENCOUNTER — Other Ambulatory Visit: Payer: Self-pay

## 2010-04-08 NOTE — Telephone Encounter (Signed)
Needs OV.  

## 2010-04-08 NOTE — Telephone Encounter (Signed)
Needs OV per office protocol - we normally dont prescribe antibx without OV

## 2010-04-08 NOTE — Telephone Encounter (Signed)
Pt advised and states he will use OTC meds and make OV if needed.

## 2010-04-08 NOTE — Telephone Encounter (Signed)
CT sinus was completely normal; I can refer to ENT for sinus (quicker) , or neurology for HA if he wants, o/w if no fever should not need further antibx; can try mucinex otc prn as well, or even allegra if having clearish drainage that could be allergic

## 2010-04-08 NOTE — Telephone Encounter (Signed)
A user error has taken place: encounter opened in error, closed for administrative reasons.

## 2010-04-08 NOTE — Telephone Encounter (Signed)
Pt states that sinus infection has not resolves, still experiencing ear pain and drainage.

## 2010-04-21 ENCOUNTER — Other Ambulatory Visit: Payer: Self-pay

## 2010-04-25 ENCOUNTER — Other Ambulatory Visit (INDEPENDENT_AMBULATORY_CARE_PROVIDER_SITE_OTHER): Payer: BC Managed Care – PPO

## 2010-04-25 DIAGNOSIS — Z0000 Encounter for general adult medical examination without abnormal findings: Secondary | ICD-10-CM

## 2010-04-25 DIAGNOSIS — Z1289 Encounter for screening for malignant neoplasm of other sites: Secondary | ICD-10-CM

## 2010-04-25 LAB — HEPATIC FUNCTION PANEL
ALT: 25 U/L (ref 0–53)
AST: 23 U/L (ref 0–37)
Alkaline Phosphatase: 80 U/L (ref 39–117)
Bilirubin, Direct: 0.2 mg/dL (ref 0.0–0.3)
Total Protein: 7.4 g/dL (ref 6.0–8.3)

## 2010-04-25 LAB — LIPID PANEL
Cholesterol: 158 mg/dL (ref 0–200)
HDL: 46.9 mg/dL (ref >39.00)
LDL Cholesterol: 86 mg/dL (ref 0–99)
Triglycerides: 128 mg/dL (ref 0.0–149.0)
VLDL: 25.6 mg/dL (ref 0.0–40.0)

## 2010-04-25 LAB — URINALYSIS
Hgb urine dipstick: NEGATIVE
Ketones, ur: NEGATIVE
Nitrite: NEGATIVE
Total Protein, Urine: NEGATIVE
Urobilinogen, UA: 0.2 (ref 0.0–1.0)

## 2010-04-25 LAB — BASIC METABOLIC PANEL
CO2: 28 mEq/L (ref 19–32)
Creatinine, Ser: 1.2 mg/dL (ref 0.4–1.5)
GFR: 84.88 mL/min (ref >60.00)
Potassium: 4.1 mEq/L (ref 3.5–5.1)

## 2010-04-25 LAB — CBC WITH DIFFERENTIAL/PLATELET
Hemoglobin: 13.8 g/dL (ref 13.0–17.0)
Lymphs Abs: 1.4 10*3/uL (ref 0.7–4.0)
Neutrophils Relative %: 76.6 % (ref 43.0–77.0)

## 2010-04-25 LAB — PSA: PSA: 1.09 ng/mL (ref 0.10–4.00)

## 2010-04-28 ENCOUNTER — Ambulatory Visit (INDEPENDENT_AMBULATORY_CARE_PROVIDER_SITE_OTHER): Payer: BC Managed Care – PPO | Admitting: Internal Medicine

## 2010-04-28 ENCOUNTER — Other Ambulatory Visit (INDEPENDENT_AMBULATORY_CARE_PROVIDER_SITE_OTHER): Payer: BC Managed Care – PPO

## 2010-04-28 ENCOUNTER — Encounter: Payer: Self-pay | Admitting: Internal Medicine

## 2010-04-28 DIAGNOSIS — Z0000 Encounter for general adult medical examination without abnormal findings: Secondary | ICD-10-CM

## 2010-04-28 DIAGNOSIS — E119 Type 2 diabetes mellitus without complications: Secondary | ICD-10-CM

## 2010-04-28 DIAGNOSIS — Z0001 Encounter for general adult medical examination with abnormal findings: Secondary | ICD-10-CM | POA: Insufficient documentation

## 2010-04-28 DIAGNOSIS — F909 Attention-deficit hyperactivity disorder, unspecified type: Secondary | ICD-10-CM

## 2010-04-28 NOTE — Assessment & Plan Note (Signed)

## 2010-04-28 NOTE — Patient Instructions (Signed)
Please go to LAB in the Basement for the blood and/or urine tests to be done today Please call the number on the Blue [facility] (the [employer] [facility]) for results of testing in 2-3 days Continue all other medications as before Please return in 6 mo with Lab testing done 3-5 days before

## 2010-04-28 NOTE — Progress Notes (Signed)
Subjective:    Patient ID: Jonathan Heath, male    DOB: 06-Mar-1955, 55 y.o.   MRN: [id number]  HPI Here for wellness and f/u;  Overall doing ok;  Pt denies CP, worsening SOB, DOE, wheezing, orthopnea, PND, worsening LE edema, palpitations, dizziness or syncope.  Pt denies neurological change such as new Headache, facial or extremity weakness.  Pt denies polydipsia, polyuria, or low sugar symptoms. Pt states overall good compliance with treatment and medications, good tolerability, and trying to follow lower cholesterol diet.  Pt denies worsening depressive symptoms, suicidal ideation or panic. No fever, wt loss, night sweats, loss of appetite, or other constitutional symptoms.  Pt states good ability with ADL's, low fall risk, home safety reviewed and adequate, no significant changes in hearing or vision, and occasionally active with exercise.  Has some occasional nasal allergy congestion and pressure to left max sinus area but better with otc sudafed which he prefers. 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 Past Medical History  Diagnosis Date  . HYPERLIPIDEMIA 12/19/2006  . ERECTILE DYSFUNCTION 12/19/2006  . ADHD 12/19/2006  . EXTERNAL HEMORRHOIDS, THROMBOSED 06/01/2008  . DISC DISEASE, LUMBAR 10/04/2006  . BACK PAIN 10/05/2008  . HYPERSOMNIA 10/05/2008  . RASH-NONVESICULAR 07/22/2009  . URINARY URGENCY 06/01/2008  . FRACTURE, RIGHT LEG 10/04/2006  . ALLERGY 10/04/2006  . SINUSITIS, ACUTE 03/18/2010  . DM2 (diabetes mellitus, type 2) 04/28/2010   Past Surgical History  Procedure Date  . Hx fx right leg s/p orif     reports that he has never smoked. He does not have any smokeless tobacco history on file. He reports that he drinks alcohol. His drug history not on file. family history includes Benign prostatic hyperplasia in his father; Heart disease in his father; and Hypertension in his other. No Known Allergies Current Outpatient  Prescriptions on File Prior to Visit  Medication Sig Dispense Refill  . aspirin 81 MG EC tablet Take 81 mg by mouth daily.        . divalproex (DEPAKOTE) 250 MG 24 hr tablet Take 250 mg by mouth daily.        . methylphenidate (RITALIN) 20 MG tablet Take 20 mg by mouth. 1 by mouth two times a day every morning and at noon       . rosuvastatin (CRESTOR) 40 MG tablet Take 40 mg by mouth daily.        Marland Kitchen triamcinolone (KENALOG) 0.1 % cream Apply topically 2 (two) times daily.        . sildenafil (VIAGRA) 100 MG tablet Take 100 mg by mouth daily as needed.         Review of Systems Review of Systems  Constitutional: Negative for diaphoresis, activity change, appetite change and unexpected weight change.  HENT: Negative for hearing loss, ear pain, facial swelling, mouth sores and neck stiffness.   Eyes: Negative for pain, redness and visual disturbance.  Respiratory: Negative for shortness of breath and wheezing.   Cardiovascular: Negative for chest pain and palpitations.  Gastrointestinal: Negative for diarrhea, blood in stool, abdominal distention and rectal pain.  Genitourinary: Negative for hematuria, flank pain and decreased urine volume.  Musculoskeletal: Negative for myalgias and joint swelling.  Skin: Negative for color change and wound.  Neurological: Negative for syncope and numbness.  Hematological: Negative for adenopathy.  Psychiatric/Behavioral: Negative for hallucinations, self-injury, decreased concentration and agitation.      Objective:   Physical ExamBP  120/80  Pulse 92  Temp(Src) 98.8 F (37.1 C) (Oral)  Ht 5\' 9"  (1.753 m)  Wt 196 lb 2 oz (88.962 kg)  BMI 28.96 kg/m2  SpO2 95% Physical Exam  VS noted Constitutional: Pt is oriented to person, place, and time. Appears well-developed and well-nourished.  HENT:  Head: Normocephalic and atraumatic.  Right Ear: External ear normal.  Left Ear: External ear normal.  Nose: Nose normal.  Mouth/Throat: Oropharynx is clear  and moist.  Eyes: Conjunctivae and EOM are normal. Pupils are equal, round, and reactive to light.  Neck: Normal range of motion. Neck supple. No JVD present. No tracheal deviation present.  Cardiovascular: Normal rate, regular rhythm, normal heart sounds and intact distal pulses.   Pulmonary/Chest: Effort normal and breath sounds normal.  Abdominal: Soft. Bowel sounds are normal. There is no tenderness.  Musculoskeletal: Normal range of motion. Exhibits no edema.  Lymphadenopathy:  Has no cervical adenopathy.  Neurological: Pt is alert and oriented to person, place, and time. Pt has normal reflexes. No cranial nerve deficit.  Skin: Skin is warm and dry. No rash noted.  Psychiatric:  Has  normal mood and affect. Behavior is normal.          Assessment & Plan:

## 2010-04-28 NOTE — Assessment & Plan Note (Signed)
stable overall by hx and exam, most recent lab reviewed with pt, and pt to continue medical treatment as before, to check a1c today - consider metformin for a1c > 7.0  Lab Results  Component Value Date   HGBA1C 7.0* 03/04/2009

## 2010-04-29 ENCOUNTER — Other Ambulatory Visit: Payer: Self-pay

## 2010-04-29 MED ORDER — METFORMIN HCL ER 500 MG PO TB24: 500.0000 mg | ORAL_TABLET | Freq: Every day | ORAL | Status: DC

## 2010-05-04 ENCOUNTER — Other Ambulatory Visit: Payer: Self-pay | Admitting: Internal Medicine

## 2010-07-07 ENCOUNTER — Other Ambulatory Visit: Payer: Self-pay | Admitting: Internal Medicine

## 2010-07-22 ENCOUNTER — Telehealth: Payer: Self-pay

## 2010-07-22 MED ORDER — METFORMIN HCL ER 500 MG PO TB24: 500.0000 mg | ORAL_TABLET | Freq: Every day | ORAL | Status: DC

## 2010-07-22 NOTE — Telephone Encounter (Signed)
Pt called requesting a 90 day generic alternative to Crestor due to cost.

## 2010-07-22 NOTE — Telephone Encounter (Signed)
Ok to change to lipitor 80 mg per day  - to robin to handle as per routine refill

## 2010-07-25 ENCOUNTER — Other Ambulatory Visit: Payer: Self-pay

## 2010-07-25 MED ORDER — ATORVASTATIN CALCIUM 80 MG PO TABS: 80.0000 mg | ORAL_TABLET | Freq: Every day | ORAL | Status: DC

## 2010-10-27 ENCOUNTER — Encounter: Payer: Self-pay | Admitting: Internal Medicine

## 2010-10-27 ENCOUNTER — Ambulatory Visit (INDEPENDENT_AMBULATORY_CARE_PROVIDER_SITE_OTHER): Payer: BC Managed Care – PPO | Admitting: Internal Medicine

## 2010-10-27 VITALS — BP 112/80 | HR 93 | Temp 98.6°F | Ht 69.0 in | Wt 193.2 lb

## 2010-10-27 DIAGNOSIS — J209 Acute bronchitis, unspecified: Secondary | ICD-10-CM

## 2010-10-27 DIAGNOSIS — E785 Hyperlipidemia, unspecified: Secondary | ICD-10-CM

## 2010-10-27 DIAGNOSIS — E119 Type 2 diabetes mellitus without complications: Secondary | ICD-10-CM

## 2010-10-27 DIAGNOSIS — Z0000 Encounter for general adult medical examination without abnormal findings: Secondary | ICD-10-CM

## 2010-10-27 MED ORDER — HYDROCODONE-HOMATROPINE 5-1.5 MG/5ML PO SYRP: 5.0000 mL | ORAL_SOLUTION | Freq: Four times a day (QID) | ORAL | Status: AC | PRN

## 2010-10-27 MED ORDER — LEVOFLOXACIN 500 MG PO TABS: 500.0000 mg | ORAL_TABLET | Freq: Every day | ORAL | Status: DC

## 2010-10-27 MED ORDER — ROSUVASTATIN CALCIUM 40 MG PO TABS: 40.0000 mg | ORAL_TABLET | Freq: Every day | ORAL | Status: DC

## 2010-10-27 MED ORDER — METFORMIN HCL ER 500 MG PO TB24: 500.0000 mg | ORAL_TABLET | Freq: Every day | ORAL | Status: DC

## 2010-10-27 NOTE — Patient Instructions (Addendum)
Take all new medications as prescribed - the antibiotic (sent to the pharmacy) and the cough medicine (hardcopy) Continue all other medications as before (the crestor and metformin were sent to the pharmacy) You can also take Mucinex (or it's generic off brand) for congestion Please go to LAB in the Basement for the blood and/or urine tests to be done today Please call the phone number [phone] (the PhoneTree System) for results of testing in 2-3 days;  When calling, simply dial the number, and when prompted enter the MRN number above (the Medical Record Number) and the # key, then the message should start. Please return in 6 mo with Lab testing done 3-5 days before

## 2010-10-27 NOTE — Assessment & Plan Note (Signed)
stable overall by hx and exam, most recent data reviewed with pt, and pt to continue medical treatment as before  Lab Results  Component Value Date   HGBA1C 7.3* 04/28/2010   To re-check lab today

## 2010-10-27 NOTE — Assessment & Plan Note (Signed)
stable overall by hx and exam, most recent data reviewed with pt, and pt to continue medical treatment as before  Lab Results  Component Value Date   LDLCALC 86 04/25/2010

## 2010-10-27 NOTE — Assessment & Plan Note (Signed)
Mild to mod, for antibx course,  to f/u any worsening symptoms or concerns 

## 2010-10-27 NOTE — Progress Notes (Signed)
Subjective:    Patient ID: Jonathan Heath, male    DOB: 08-Sep-1955, 55 y.o.   MRN: [id number]  HPI  Here with acute onset mild to mod 2-3 days ST, HA, general weakness and malaise, with prod cough greenish sputum, but Pt denies chest pain, increased sob or doe, wheezing, orthopnea, PND, increased LE swelling, palpitations, dizziness or syncope.  Here to f/u; overall doing ok,  Pt denies chest pain, increased sob or doe, wheezing, orthopnea, PND, increased LE swelling, palpitations, dizziness or syncope.  Pt denies new neurological symptoms such as new headache, or facial or extremity weakness or numbness   Pt denies polydipsia, polyuria, or low sugar symptoms such as weakness or confusion improved with po intake.  Pt states overall good compliance with meds, trying to follow lower cholesterol, diabetic diet, wt overall stable but little exercise however. Past Medical History  Diagnosis Date  . HYPERLIPIDEMIA 12/19/2006  . ERECTILE DYSFUNCTION 12/19/2006  . ADHD 12/19/2006  . EXTERNAL HEMORRHOIDS, THROMBOSED 06/01/2008  . DISC DISEASE, LUMBAR 10/04/2006  . BACK PAIN 10/05/2008  . HYPERSOMNIA 10/05/2008  . RASH-NONVESICULAR 07/22/2009  . URINARY URGENCY 06/01/2008  . FRACTURE, RIGHT LEG 10/04/2006  . ALLERGY 10/04/2006  . SINUSITIS, ACUTE 03/18/2010  . DM2 (diabetes mellitus, type 2) 04/28/2010   Past Surgical History  Procedure Date  . Hx fx right leg s/p orif     reports that he has never smoked. He does not have any smokeless tobacco history on file. He reports that he drinks alcohol. His drug history not on file. family history includes Benign prostatic hyperplasia in his father; Heart disease in his father; and Hypertension in his other. No Known Allergies Current Outpatient Prescriptions on File Prior to Visit  Medication Sig Dispense Refill  . aspirin 81 MG EC tablet Take 81 mg by mouth daily.        . divalproex (DEPAKOTE) 250 MG 24 hr tablet Take 250 mg by mouth daily.        .  methylphenidate (RITALIN) 20 MG tablet Take 20 mg by mouth. 1 by mouth two times a day every morning and at noon       . triamcinolone (KENALOG) 0.1 % cream Apply topically 2 (two) times daily.         Review of Systems Review of Systems  Constitutional: Negative for diaphoresis and unexpected weight change.  HENT: Negative for drooling and tinnitus.   Eyes: Negative for photophobia and visual disturbance.  Respiratory: Negative for choking and stridor.   Gastrointestinal: Negative for vomiting and blood in stool.  Genitourinary: Negative for hematuria and decreased urine volume.     Objective:   Physical Exam BP 112/80  Pulse 93  Temp(Src) 98.6 F (37 C) (Oral)  Ht 5\' 9"  (1.753 m)  Wt 193 lb 4 oz (87.658 kg)  BMI 28.54 kg/m2  SpO2 98% Physical Exam  VS noted, mild ill Constitutional: Pt appears well-developed and well-nourished.  HENT: Head: Normocephalic.  Right Ear: External ear normal.  Left Ear: External ear normal.  Bilat tm's mild erythema.  Sinus nontender.  Pharynx mild erythema Eyes: Conjunctivae and EOM are normal. Pupils are equal, round, and reactive to light.  Neck: Normal range of motion. Neck supple.  Cardiovascular: Normal rate and regular rhythm.   Pulmonary/Chest: Effort normal and breath sounds normal.  Neurological: Pt is alert. No cranial nerve deficit.  Skin: Skin is warm. No erythema.  Psychiatric: Pt behavior is normal. Thought content normal.  Assessment & Plan:

## 2010-11-01 ENCOUNTER — Telehealth: Payer: Self-pay

## 2010-11-01 MED ORDER — CEPHALEXIN 500 MG PO CAPS: 500.0000 mg | ORAL_CAPSULE | Freq: Four times a day (QID) | ORAL | Status: AC

## 2010-11-01 MED ORDER — ALBUTEROL SULFATE HFA 108 (90 BASE) MCG/ACT IN AERS: 2.0000 | INHALATION_SPRAY | Freq: Four times a day (QID) | RESPIRATORY_TRACT | Status: DC | PRN

## 2010-11-01 NOTE — Telephone Encounter (Signed)
Done  Per emr 

## 2010-11-01 NOTE — Telephone Encounter (Signed)
Pt called stating that Levaquin has caused nausea, dizziness a cold sweats. Pt is requesting an alternate ABX and a fast acting inhaler as well. Please advise.

## 2010-11-01 NOTE — Telephone Encounter (Signed)
Pt's spouse advised of Rx/pharmacy 

## 2011-04-24 ENCOUNTER — Telehealth: Payer: Self-pay

## 2011-04-24 DIAGNOSIS — Z0000 Encounter for general adult medical examination without abnormal findings: Secondary | ICD-10-CM

## 2011-04-24 NOTE — Telephone Encounter (Signed)
Put lab order in. 

## 2011-04-27 ENCOUNTER — Ambulatory Visit: Payer: BC Managed Care – PPO | Admitting: Internal Medicine

## 2011-05-22 ENCOUNTER — Other Ambulatory Visit (INDEPENDENT_AMBULATORY_CARE_PROVIDER_SITE_OTHER): Payer: BC Managed Care – PPO

## 2011-05-22 DIAGNOSIS — Z0000 Encounter for general adult medical examination without abnormal findings: Secondary | ICD-10-CM

## 2011-05-22 DIAGNOSIS — E119 Type 2 diabetes mellitus without complications: Secondary | ICD-10-CM

## 2011-05-22 LAB — HEPATIC FUNCTION PANEL
ALT: 16 U/L (ref 0–53)
Albumin: 3.8 g/dL (ref 3.5–5.2)
Bilirubin, Direct: 0.2 mg/dL (ref 0.0–0.3)
Total Protein: 7.1 g/dL (ref 6.0–8.3)

## 2011-05-22 LAB — URINALYSIS, ROUTINE W REFLEX MICROSCOPIC
Ketones, ur: NEGATIVE
Leukocytes, UA: NEGATIVE
Nitrite: NEGATIVE
Specific Gravity, Urine: 1.02 (ref 1.000–1.030)
Urobilinogen, UA: 0.2 (ref 0.0–1.0)
pH: 6 (ref 5.0–8.0)

## 2011-05-22 LAB — LIPID PANEL
Cholesterol: 136 mg/dL (ref 0–200)
Total CHOL/HDL Ratio: 3
Triglycerides: 128 mg/dL (ref 0.0–149.0)

## 2011-05-22 LAB — CBC WITH DIFFERENTIAL/PLATELET
Basophils Absolute: 0 10*3/uL (ref 0.0–0.1)
Basophils Relative: 0.3 % (ref 0.0–3.0)
Eosinophils Absolute: 0.1 10*3/uL (ref 0.0–0.7)
Lymphocytes Relative: 19.8 % (ref 12.0–46.0)
MCHC: 32.8 g/dL (ref 30.0–36.0)
Monocytes Relative: 6.4 % (ref 3.0–12.0)
Neutrophils Relative %: 72.6 % (ref 43.0–77.0)
RBC: 4.42 Mil/uL (ref 4.22–5.81)

## 2011-05-22 LAB — TSH: TSH: 2.22 u[IU]/mL (ref 0.35–5.50)

## 2011-05-22 LAB — BASIC METABOLIC PANEL
BUN: 17 mg/dL (ref 6–23)
CO2: 27 mEq/L (ref 19–32)
Chloride: 105 mEq/L (ref 96–112)
Creatinine, Ser: 1.1 mg/dL (ref 0.4–1.5)
Glucose, Bld: 199 mg/dL — ABNORMAL HIGH (ref 70–99)

## 2011-05-22 LAB — MICROALBUMIN / CREATININE URINE RATIO
Creatinine,U: 120.2 mg/dL
Microalb Creat Ratio: 0.4 mg/g (ref 0.0–30.0)

## 2011-05-22 LAB — HEMOGLOBIN A1C: Hgb A1c MFr Bld: 7.7 % — ABNORMAL HIGH (ref 4.6–6.5)

## 2011-05-22 LAB — PSA: PSA: 1.71 ng/mL (ref 0.10–4.00)

## 2011-05-24 ENCOUNTER — Encounter: Payer: Self-pay | Admitting: Internal Medicine

## 2011-05-24 ENCOUNTER — Other Ambulatory Visit: Payer: Self-pay | Admitting: Internal Medicine

## 2011-05-24 ENCOUNTER — Ambulatory Visit (INDEPENDENT_AMBULATORY_CARE_PROVIDER_SITE_OTHER): Payer: BC Managed Care – PPO | Admitting: Internal Medicine

## 2011-05-24 VITALS — BP 142/80 | HR 86 | Temp 98.1°F | Ht 69.0 in | Wt 198.1 lb

## 2011-05-24 DIAGNOSIS — I10 Essential (primary) hypertension: Secondary | ICD-10-CM

## 2011-05-24 DIAGNOSIS — F528 Other sexual dysfunction not due to a substance or known physiological condition: Secondary | ICD-10-CM

## 2011-05-24 DIAGNOSIS — E119 Type 2 diabetes mellitus without complications: Secondary | ICD-10-CM

## 2011-05-24 DIAGNOSIS — Z0000 Encounter for general adult medical examination without abnormal findings: Secondary | ICD-10-CM

## 2011-05-24 MED ORDER — SILDENAFIL CITRATE 100 MG PO TABS: 50.0000 mg | ORAL_TABLET | Freq: Every day | ORAL | Status: DC | PRN

## 2011-05-24 MED ORDER — GLUCOSE BLOOD VI STRP: ORAL_STRIP | Status: DC

## 2011-05-24 MED ORDER — METFORMIN HCL ER 500 MG PO TB24: 1000.0000 mg | ORAL_TABLET | Freq: Every day | ORAL | Status: DC

## 2011-05-24 MED ORDER — LISINOPRIL 5 MG PO TABS: 5.0000 mg | ORAL_TABLET | Freq: Every day | ORAL | Status: DC

## 2011-05-24 NOTE — Patient Instructions (Addendum)
Increase the metformin to 2 pills in the AM Re-start the Aspirin at 81 gm per day - COATED only You are given the new meter today Your prescription for the strips were sent to the pharmacy Please also start Lisinopril 5 mg per day (for blood pressure, but also helps the kidneys) Take all new medications as prescribed - the viagra (hardcopy today) Continue all other medications as before Please have the pharmacy call with any other refills you may need. Please return in 6 mo with Lab testing done 3-5 days before

## 2011-05-24 NOTE — Progress Notes (Signed)
Subjective:    Patient ID: Jonathan Heath, male    DOB: 01/18/1955, 55 y.o.   MRN: [id number]  HPI  Here for wellness and f/u;  Overall doing ok;  Pt denies CP, worsening SOB, DOE, wheezing, orthopnea, PND, worsening LE edema, palpitations, dizziness or syncope.  Pt denies neurological change such as new Headache, facial or extremity weakness.  Pt denies polydipsia, polyuria, or low sugar symptoms. Pt states overall good compliance with treatment and medications, good tolerability, and trying to follow lower cholesterol diet.  Pt denies worsening depressive symptoms, suicidal ideation or panic. No fever, wt loss, night sweats, loss of appetite, or other constitutional symptoms.  Pt states good ability with ADL's, low fall risk, home safety reviewed and adequate, no significant changes in hearing or vision, and occasionally active with exercise.  Has been out of [facility]SA for 2 mo.  Has worening ED symptoms over the last 6 mo.  No other acute complaints.  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 HYPERLIPIDEMIA 12/19/2006  . ERECTILE DYSFUNCTION 12/19/2006  . ADHD 12/19/2006  . EXTERNAL HEMORRHOIDS, THROMBOSED 06/01/2008  . DISC DISEASE, LUMBAR 10/04/2006  . BACK PAIN 10/05/2008  . HYPERSOMNIA 10/05/2008  . RASH-NONVESICULAR 07/22/2009  . URINARY URGENCY 06/01/2008  . FRACTURE, RIGHT LEG 10/04/2006  . ALLERGY 10/04/2006  . SINUSITIS, ACUTE 03/18/2010  . DM2 (diabetes mellitus, type 2) 04/28/2010   Past Surgical History  Procedure Date  . Hx fx right leg s/p orif     reports that he has never smoked. He does not have any smokeless tobacco history on file. He reports that he drinks alcohol. His drug history not on file. family history includes Benign prostatic hyperplasia in his father; Heart disease in his father; and Hypertension in his other. Allergies  Allergen Reactions  . Levofloxacin Nausea Only   Current Outpatient Prescriptions on File Prior to Visit    Medication Sig Dispense Refill  . albuterol (PROVENTIL HFA;VENTOLIN HFA) 108 (90 BASE) MCG/ACT inhaler Inhale 2 puffs into the lungs every 6 (six) hours as needed for wheezing.  1 Inhaler  1  . aspirin 81 MG EC tablet Take 81 mg by mouth daily.        . divalproex (DEPAKOTE) 250 MG 24 hr tablet Take 250 mg by mouth daily.        . metFORMIN (GLUCOPHAGE XR) 500 MG 24 hr tablet Take 2 tablets (1,000 mg total) by mouth daily with breakfast.  180 tablet  3  . methylphenidate (RITALIN) 20 MG tablet Take 20 mg by mouth. 1 by mouth two times a day every morning and at noon       . rosuvastatin (CRESTOR) 40 MG tablet Take 1 tablet (40 mg total) by mouth daily.  90 tablet  3  . triamcinolone (KENALOG) 0.1 % cream Apply topically 2 (two) times daily.        Marland Kitchen lisinopril (PRINIVIL,ZESTRIL) 5 MG tablet Take 1 tablet (5 mg total) by mouth daily.  90 tablet  3  . sildenafil (VIAGRA) 100 MG tablet Take 0.5-1 tablets (50-100 mg total) by mouth daily as needed for erectile dysfunction.  5 tablet  11   Review of Systems Review of Systems  Constitutional: Negative for diaphoresis, activity change, appetite change and unexpected weight change.  HENT: Negative for hearing loss, ear pain, facial swelling, mouth sores and neck stiffness.   Eyes: Negative for pain, redness and visual disturbance.  Respiratory: Negative for shortness of breath and wheezing.   Cardiovascular: Negative for chest pain and palpitations.  Gastrointestinal: Negative for diarrhea, blood in stool, abdominal distention and rectal pain.  Genitourinary: Negative for hematuria, flank pain and decreased urine volume.  Musculoskeletal: Negative for myalgias and joint swelling.  Skin: Negative for color change and wound.  Neurological: Negative for syncope and numbness.  Hematological: Negative for adenopathy.  Psychiatric/Behavioral: Negative for hallucinations, self-injury, decreased concentration and agitation.      Objective:   Physical  Exam BP 142/80  Pulse 86  Temp(Src) 98.1 F (36.7 C) (Oral)  Ht 5\' 9"  (1.753 m)  Wt 198 lb 2 oz (89.869 kg)  BMI 29.26 kg/m2  SpO2 96% Physical Exam  VS noted Constitutional: Pt is oriented to person, place, and time. Appears well-developed and well-nourished.  HENT:  Head: Normocephalic and atraumatic.  Right Ear: External ear normal.  Left Ear: External ear normal.  Nose: Nose normal.  Mouth/Throat: Oropharynx is clear and moist.  Eyes: Conjunctivae and EOM are normal. Pupils are equal, round, and reactive to light.  Neck: Normal range of motion. Neck supple. No JVD present. No tracheal deviation present.  Cardiovascular: Normal rate, regular rhythm, normal heart sounds and intact distal pulses.   Pulmonary/Chest: Effort normal and breath sounds normal.  Abdominal: Soft. Bowel sounds are normal. There is no tenderness.  Musculoskeletal: Normal range of motion. Exhibits no edema.  Lymphadenopathy:  Has no cervical adenopathy.  Neurological: Pt is alert and oriented to person, place, and time. Pt has normal reflexes. No cranial nerve deficit.  Skin: Skin is warm and dry. No rash noted.  Psychiatric:  Has  normal mood and affect. Behavior is normal.     Assessment & Plan:

## 2011-05-24 NOTE — Assessment & Plan Note (Signed)

## 2011-05-28 ENCOUNTER — Encounter: Payer: Self-pay | Admitting: Internal Medicine

## 2011-05-28 DIAGNOSIS — I10 Essential (primary) hypertension: Secondary | ICD-10-CM | POA: Insufficient documentation

## 2011-05-28 NOTE — Assessment & Plan Note (Signed)
For viagra prn 

## 2011-05-28 NOTE — Assessment & Plan Note (Signed)
Mild, for lisinopril 5 qd

## 2011-05-28 NOTE — Assessment & Plan Note (Addendum)
Mild uncontrolled, to incr the metformin to 2 qd Lab Results  Component Value Date   HGBA1C 7.7* 05/22/2011

## 2011-10-04 ENCOUNTER — Encounter: Payer: Self-pay | Admitting: Internal Medicine

## 2011-10-04 ENCOUNTER — Ambulatory Visit (INDEPENDENT_AMBULATORY_CARE_PROVIDER_SITE_OTHER): Payer: BC Managed Care – PPO | Admitting: Internal Medicine

## 2011-10-04 VITALS — BP 136/90 | HR 80 | Temp 97.3°F | Ht 69.5 in | Wt 194.3 lb

## 2011-10-04 DIAGNOSIS — E119 Type 2 diabetes mellitus without complications: Secondary | ICD-10-CM

## 2011-10-04 DIAGNOSIS — IMO0002 Reserved for concepts with insufficient information to code with codable children: Secondary | ICD-10-CM

## 2011-10-04 DIAGNOSIS — Z23 Encounter for immunization: Secondary | ICD-10-CM

## 2011-10-04 DIAGNOSIS — M5416 Radiculopathy, lumbar region: Secondary | ICD-10-CM

## 2011-10-04 DIAGNOSIS — I10 Essential (primary) hypertension: Secondary | ICD-10-CM

## 2011-10-04 MED ORDER — CYCLOBENZAPRINE HCL 5 MG PO TABS: 5.0000 mg | ORAL_TABLET | Freq: Three times a day (TID) | ORAL | Status: AC | PRN

## 2011-10-04 MED ORDER — PREDNISONE 10 MG PO TABS: ORAL_TABLET | ORAL | Status: DC

## 2011-10-04 MED ORDER — TRAMADOL HCL 50 MG PO TABS: 50.0000 mg | ORAL_TABLET | Freq: Four times a day (QID) | ORAL | Status: AC | PRN

## 2011-10-04 NOTE — Patient Instructions (Addendum)
You had the flu shot today Take all new medications as prescribed Continue all other medications as before Please have the pharmacy call with any refills you may need. You are given the work note today Please return if not improved in 1-2 wks

## 2011-10-07 ENCOUNTER — Encounter: Payer: Self-pay | Admitting: Internal Medicine

## 2011-10-07 NOTE — Assessment & Plan Note (Signed)
stable overall by hx and exam, most recent data reviewed with pt, and pt to continue medical treatment as before Lab Results  Component Value Date   HGBA1C 7.7* 05/22/2011

## 2011-10-07 NOTE — Progress Notes (Signed)
Subjective:    Patient ID: Jonathan Heath, male    DOB: 18-May-1955, 56 y.o.   MRN: [id number]  HPI  Here to f/u; overall doing ok,  Pt denies chest pain, increased sob or doe, wheezing, orthopnea, PND, increased LE swelling, palpitations, dizziness or syncope.  Pt denies new neurological symptoms such as new headache, or facial or extremity weakness or numbness   Pt denies polydipsia, polyuria, or low sugar symptoms such as weakness or confusion improved with po intake.  Pt states overall good compliance with meds, trying to follow lower cholesterol, diabetic diet, wt overall stable but little exercise however.  Did also have onset LBP, midl to mod, with bending at work yesterday, with a pop and now pain , constant, sharp , radiates to the left buttock.  Pt has no bowel or bladder change, fever, wt loss, worsening LLE weakness or numb,  gait change or falls. Past Medical History  Diagnosis Date  . HYPERLIPIDEMIA 12/19/2006  . ERECTILE DYSFUNCTION 12/19/2006  . ADHD 12/19/2006  . EXTERNAL HEMORRHOIDS, THROMBOSED 06/01/2008  . DISC DISEASE, LUMBAR 10/04/2006  . BACK PAIN 10/05/2008  . HYPERSOMNIA 10/05/2008  . RASH-NONVESICULAR 07/22/2009  . URINARY URGENCY 06/01/2008  . FRACTURE, RIGHT LEG 10/04/2006  . ALLERGY 10/04/2006  . SINUSITIS, ACUTE 03/18/2010  . DM2 (diabetes mellitus, type 2) 04/28/2010  . Hypertension 05/28/2011   Past Surgical History  Procedure Date  . Hx fx right leg s/p orif     reports that he has never smoked. He does not have any smokeless tobacco history on file. He reports that he drinks alcohol. His drug history not on file. family history includes Benign prostatic hyperplasia in his father; Heart disease in his father; and Hypertension in his other. Allergies  Allergen Reactions  . Levofloxacin Nausea Only  . Lisinopril Other (See Comments)    Dizzy, confused, nausea, palp's and sweats   Current Outpatient Prescriptions on File Prior to Visit  Medication Sig Dispense Refill    . aspirin 81 MG EC tablet Take 81 mg by mouth daily.        . divalproex (DEPAKOTE) 250 MG 24 hr tablet Take 250 mg by mouth daily.        . metFORMIN (GLUCOPHAGE XR) 500 MG 24 hr tablet Take 2 tablets (1,000 mg total) by mouth daily with breakfast.  180 tablet  3  . methylphenidate (RITALIN) 20 MG tablet Take 20 mg by mouth. 1 by mouth two times a day every morning and at noon       . ONE TOUCH ULTRA TEST test strip USE AS DIRECTED  300 each  3  . rosuvastatin (CRESTOR) 40 MG tablet Take 1 tablet (40 mg total) by mouth daily.  90 tablet  3  . albuterol (PROVENTIL HFA;VENTOLIN HFA) 108 (90 BASE) MCG/ACT inhaler Inhale 2 puffs into the lungs every 6 (six) hours as needed for wheezing.  1 Inhaler  1  . sildenafil (VIAGRA) 100 MG tablet Take 0.5-1 tablets (50-100 mg total) by mouth daily as needed for erectile dysfunction.  5 tablet  11  . triamcinolone (KENALOG) 0.1 % cream Apply topically 2 (two) times daily.         Review of Systems  Constitutional: Negative for diaphoresis and unexpected weight change.  HENT: Negative for tinnitus.   Eyes: Negative for photophobia and visual disturbance.  Respiratory: Negative for choking and stridor.   Gastrointestinal: Negative for vomiting and blood in stool.  Genitourinary: Negative for hematuria and decreased urine  volume.  Musculoskeletal: Negative for gait problem.  Skin: Negative for color change and wound.  Neurological: Negative for tremors and numbness.  Psychiatric/Behavioral: Negative for decreased concentration. The patient is not hyperactive.       Objective:   Physical Exam BP 136/90  Pulse 80  Temp 97.3 F (36.3 C) (Oral)  Ht 5' 9.5" (1.765 m)  Wt 194 lb 5 oz (88.14 kg)  BMI 28.28 kg/m2  SpO2 98% Physical Exam  VS noted Constitutional: Pt appears well-developed and well-nourished.  HENT: Head: Normocephalic.  Right Ear: External ear normal.  Left Ear: External ear normal.  Eyes: Conjunctivae and EOM are normal. Pupils are  equal, round, and reactive to light.  Neck: Normal range of motion. Neck supple.  Cardiovascular: Normal rate and regular rhythm.   Pulmonary/Chest: Effort normal and breath sounds normal.  Abd:  Soft, NT, non-distended, + BS Neurological: Pt is alert. Not confused , motor/sens/dtr intact throughout, gait intact Skin: Skin is warm. No erythema.  Spine:  Tender over left SI joint area only Psychiatric: Pt behavior is normal. Thought content normal.     Assessment & Plan:

## 2011-10-07 NOTE — Assessment & Plan Note (Addendum)
Mild, for flexeril prn, pain med and predpack,  to f/u any worsening symptoms or concerns

## 2011-10-07 NOTE — Assessment & Plan Note (Signed)
stable overall by hx and exam, most recent data reviewed with pt, and pt to continue medical treatment as before BP Readings from Last 3 Encounters:  10/04/11 136/90  05/24/11 142/80  10/27/10 112/80

## 2011-10-18 ENCOUNTER — Telehealth: Payer: Self-pay

## 2011-10-18 MED ORDER — ATORVASTATIN CALCIUM 80 MG PO TABS: 80.0000 mg | ORAL_TABLET | Freq: Every day | ORAL | Status: DC

## 2011-10-18 NOTE — Telephone Encounter (Signed)
Pt called requesting to change medication from Crestor to Lipitor for cost savings, please advise.

## 2011-10-18 NOTE — Telephone Encounter (Signed)
Patient informed. 

## 2011-10-18 NOTE — Telephone Encounter (Signed)
Done erx 

## 2011-11-24 ENCOUNTER — Other Ambulatory Visit (INDEPENDENT_AMBULATORY_CARE_PROVIDER_SITE_OTHER): Payer: BC Managed Care – PPO

## 2011-11-24 ENCOUNTER — Ambulatory Visit (INDEPENDENT_AMBULATORY_CARE_PROVIDER_SITE_OTHER): Payer: BC Managed Care – PPO | Admitting: Internal Medicine

## 2011-11-24 ENCOUNTER — Encounter: Payer: Self-pay | Admitting: Internal Medicine

## 2011-11-24 VITALS — BP 110/80 | HR 96 | Temp 97.3°F | Ht 69.0 in | Wt 189.5 lb

## 2011-11-24 DIAGNOSIS — Z0000 Encounter for general adult medical examination without abnormal findings: Secondary | ICD-10-CM

## 2011-11-24 DIAGNOSIS — E119 Type 2 diabetes mellitus without complications: Secondary | ICD-10-CM

## 2011-11-24 DIAGNOSIS — E785 Hyperlipidemia, unspecified: Secondary | ICD-10-CM

## 2011-11-24 DIAGNOSIS — I10 Essential (primary) hypertension: Secondary | ICD-10-CM

## 2011-11-24 LAB — LIPID PANEL
Cholesterol: 171 mg/dL (ref 0–200)
Total CHOL/HDL Ratio: 4
Triglycerides: 296 mg/dL — ABNORMAL HIGH (ref 0.0–149.0)
VLDL: 59.2 mg/dL — ABNORMAL HIGH (ref 0.0–40.0)

## 2011-11-24 LAB — BASIC METABOLIC PANEL
CO2: 30 mEq/L (ref 19–32)
Calcium: 9.7 mg/dL (ref 8.4–10.5)
Creatinine, Ser: 1.1 mg/dL (ref 0.4–1.5)
GFR: 94.65 mL/min (ref >60.00)
Sodium: 137 mEq/L (ref 135–145)

## 2011-11-24 LAB — LDL CHOLESTEROL, DIRECT: Direct LDL: 93.5 mg/dL

## 2011-11-24 NOTE — Patient Instructions (Addendum)
Continue all other medications as before Please have the pharmacy call with any refills you may need. You will be contacted regarding the referral for: Diabetes Education Class Please continue your efforts at being more active, low cholesterol diabetic diet, and weight control. Please go to LAB in the Basement for the blood and/or urine tests to be done today You will be contacted by phone if any changes need to be made immediately.  Otherwise, you will receive a letter about your results with an explanation. Please remember to sign up for My Chart at your earliest convenience, as this will be important to you in the future with finding out test results. Please return in 6 mo with Lab testing done 3-5 days before

## 2011-11-25 ENCOUNTER — Encounter: Payer: Self-pay | Admitting: Internal Medicine

## 2011-11-25 NOTE — Progress Notes (Signed)
Subjective:    Patient ID: Jonathan Heath, male    DOB: 1955/03/28, 56 y.o.   MRN: [id number]  HPI  Here to f/u; overall doing ok,  Pt denies chest pain, increased sob or doe, wheezing, orthopnea, PND, increased LE swelling, palpitations, dizziness or syncope.  Pt denies new neurological symptoms such as new headache, or facial or extremity weakness or numbness   Pt denies polydipsia, polyuria, or low sugar symptoms such as weakness or confusion improved with po intake.  Pt states overall good compliance with meds, but has not been trying to follow lower cholesterol, diabetic diet, wt increased several lbs and little exercise.   Pt denies fever, wt loss, night sweats, loss of appetite, or other constitutional symptoms  Denies worsening depressive symptoms, suicidal ideation, or panic, though has ongoing anxiety. Past Medical History  Diagnosis Date  . HYPERLIPIDEMIA 12/19/2006  . ERECTILE DYSFUNCTION 12/19/2006  . ADHD 12/19/2006  . EXTERNAL HEMORRHOIDS, THROMBOSED 06/01/2008  . DISC DISEASE, LUMBAR 10/04/2006  . BACK PAIN 10/05/2008  . HYPERSOMNIA 10/05/2008  . RASH-NONVESICULAR 07/22/2009  . URINARY URGENCY 06/01/2008  . FRACTURE, RIGHT LEG 10/04/2006  . ALLERGY 10/04/2006  . SINUSITIS, ACUTE 03/18/2010  . DM2 (diabetes mellitus, type 2) 04/28/2010  . Hypertension 05/28/2011   Past Surgical History  Procedure Date  . Hx fx right leg s/p orif     reports that he has never smoked. He does not have any smokeless tobacco history on file. He reports that he drinks alcohol. His drug history not on file. family history includes Benign prostatic hyperplasia in his father; Heart disease in his father; and Hypertension in his other. Allergies  Allergen Reactions  . Levofloxacin Nausea Only  . Lisinopril Other (See Comments)    Dizzy, confused, nausea, palp's and sweats   Current Outpatient Prescriptions on File Prior to Visit  Medication Sig Dispense Refill  . aspirin 81 MG EC tablet Take 81 mg by mouth  daily.        Marland Kitchen atorvastatin (LIPITOR) 80 MG tablet Take 1 tablet (80 mg total) by mouth daily.  90 tablet  3  . cyclobenzaprine (FLEXERIL) 5 MG tablet Take 1 tablet (5 mg total) by mouth 3 (three) times daily as needed for muscle spasms.  60 tablet  1  . divalproex (DEPAKOTE) 250 MG 24 hr tablet Take 250 mg by mouth daily.        . metFORMIN (GLUCOPHAGE XR) 500 MG 24 hr tablet Take 2 tablets (1,000 mg total) by mouth daily with breakfast.  180 tablet  3  . methylphenidate (RITALIN) 20 MG tablet Take 20 mg by mouth. 1 by mouth two times a day every morning and at noon       . naproxen (EC NAPROSYN) 500 MG EC tablet Take 500 mg by mouth 2 (two) times daily.      . ONE TOUCH ULTRA TEST test strip USE AS DIRECTED  300 each  3  . predniSONE (DELTASONE) 10 MG tablet 3 tabs by mouth per day for 3 days,2tabs per day for 3 days,1tab per day for 3 days  18 tablet  0  . traMADol (ULTRAM) 50 MG tablet Take 1 tablet (50 mg total) by mouth every 6 (six) hours as needed for pain.  60 tablet  1  . triamcinolone (KENALOG) 0.1 % cream Apply topically 2 (two) times daily.        Marland Kitchen albuterol (PROVENTIL HFA;VENTOLIN HFA) 108 (90 BASE) MCG/ACT inhaler Inhale 2 puffs into the lungs  every 6 (six) hours as needed for wheezing.  1 Inhaler  1  . sildenafil (VIAGRA) 100 MG tablet Take 0.5-1 tablets (50-100 mg total) by mouth daily as needed for erectile dysfunction.  5 tablet  11   Review of Systems  Constitutional: Negative for diaphoresis and unexpected weight change.  HENT: Negative for tinnitus.   Eyes: Negative for photophobia and visual disturbance.  Respiratory: Negative for choking and stridor.   Gastrointestinal: Negative for vomiting and blood in stool.  Genitourinary: Negative for hematuria and decreased urine volume.  Musculoskeletal: Negative for gait problem.  Skin: Negative for color change and wound.  Neurological: Negative for tremors and numbness.  Psychiatric/Behavioral: Negative for decreased  concentration. The patient is not hyperactive.       Objective:   Physical Exam BP 110/80  Pulse 96  Temp 97.3 F (36.3 C) (Oral)  Ht 5\' 9"  (1.753 m)  Wt 189 lb 8 oz (85.957 kg)  BMI 27.98 kg/m2  SpO2 98% Physical Exam  VS noted, not ill appearing Constitutional: Pt appears well-developed and well-nourished. Lavella Lemons HENT: Head: Normocephalic.  Right Ear: External ear normal.  Left Ear: External ear normal.  Eyes: Conjunctivae and EOM are normal. Pupils are equal, round, and reactive to light.  Neck: Normal range of motion. Neck supple.  Cardiovascular: Normal rate and regular rhythm.   Pulmonary/Chest: Effort normal and breath sounds normal.  Abd:  Soft, NT, non-distended, + BS Neurological: Pt is alert. Not confused  Skin: Skin is warm. No erythema.  Psychiatric: Pt behavior is normal. Thought content normal.     Assessment & Plan:

## 2011-11-25 NOTE — Assessment & Plan Note (Signed)
Lab Results  Component Value Date   HGBA1C 8.4* 11/24/2011   Not checking cbg's at home, but a1c marked elev likely due to dietary excess and wt increase;  Pt dec, to return to better diet, also refer DM education, f/u lab next visit

## 2011-11-25 NOTE — Assessment & Plan Note (Signed)
stable overall by hx and exam, most recent data reviewed with pt, and pt to continue medical treatment as before BP Readings from Last 3 Encounters:  11/24/11 110/80  10/04/11 136/90  05/24/11 142/80

## 2011-11-25 NOTE — Assessment & Plan Note (Signed)
stable overall by hx and exam, most recent data reviewed with pt, and pt to continue medical treatment as before Lab Results  Component Value Date   LDLCALC 67 05/22/2011

## 2011-12-05 ENCOUNTER — Encounter: Payer: BC Managed Care – PPO | Attending: Internal Medicine

## 2012-01-05 ENCOUNTER — Encounter: Payer: Self-pay | Admitting: Internal Medicine

## 2012-01-05 ENCOUNTER — Ambulatory Visit (INDEPENDENT_AMBULATORY_CARE_PROVIDER_SITE_OTHER): Payer: BC Managed Care – PPO | Admitting: Internal Medicine

## 2012-01-05 VITALS — BP 132/92 | HR 108 | Temp 98.8°F | Ht 69.0 in | Wt 188.2 lb

## 2012-01-05 DIAGNOSIS — E119 Type 2 diabetes mellitus without complications: Secondary | ICD-10-CM

## 2012-01-05 DIAGNOSIS — I10 Essential (primary) hypertension: Secondary | ICD-10-CM

## 2012-01-05 DIAGNOSIS — J019 Acute sinusitis, unspecified: Secondary | ICD-10-CM

## 2012-01-05 MED ORDER — HYDROCODONE-HOMATROPINE 5-1.5 MG/5ML PO SYRP: 5.0000 mL | ORAL_SOLUTION | Freq: Four times a day (QID) | ORAL | Status: DC | PRN

## 2012-01-05 MED ORDER — AZITHROMYCIN 250 MG PO TABS: ORAL_TABLET | ORAL | Status: DC

## 2012-01-05 NOTE — Progress Notes (Signed)
  Subjective:    HPI  complains of head cold symptoms, ?sinusitus Onset >1 week ago, initially improved then relapsing and worse symptoms  First associated with rhinorrhea, sneezing, sore throat, mild headache and low grade fever Now sinus pressure and mild-mod nasal congestion, yellow discharge No relief with OTC meds Precipitated by sick contacts and weather change  Past Medical History  Diagnosis Date  . HYPERLIPIDEMIA   . ERECTILE DYSFUNCTION   . ADHD   . DISC DISEASE, LUMBAR   . FRACTURE, RIGHT LEG   . DM2 (diabetes mellitus, type 2)   . Hypertension     Review of Systems Constitutional: No night sweats, no unexpected weight change Pulmonary: No pleurisy or hemoptysis Cardiovascular: No chest pain or palpitations     Objective:   Physical Exam BP 132/92  Pulse 108  Temp 98.8 F (37.1 C) (Oral)  Ht 5\' 9"  (1.753 m)  Wt 188 lb 3.2 oz (85.367 kg)  BMI 27.79 kg/m2  SpO2 97% GEN: mildly ill appearing and audible head congestion HENT: NCAT, mild sinus tenderness bilaterally, nares with thick discharge and turbinate swelling, oropharynx mild erythema and PND, no exudate Eyes: Vision grossly intact, no conjunctivitis Lungs: Clear to auscultation without rhonchi or wheeze, no increased work of breathing Cardiovascular: Regular rate and rhythm, no bilateral edema  Lab Results  Component Value Date   WBC 8.6 05/22/2011   HGB 12.1* 05/22/2011   HCT 36.7* 05/22/2011   PLT 183.0 05/22/2011   GLUCOSE 202* 11/24/2011   CHOL 171 11/24/2011   TRIG 296.0* 11/24/2011   HDL 41.30 11/24/2011   LDLDIRECT 93.5 11/24/2011   LDLCALC 67 05/22/2011   ALT 16 05/22/2011   AST 19 05/22/2011   NA 137 11/24/2011   K 4.3 11/24/2011   CL 102 11/24/2011   CREATININE 1.1 11/24/2011   BUN 10 11/24/2011   CO2 30 11/24/2011   TSH 2.22 05/22/2011   PSA 1.05 11/24/2011   HGBA1C 8.4* 11/24/2011   MICROALBUR 0.5 05/22/2011      Assessment & Plan:  Viral URI > progression to acute sinusitis Cough, postnasal drip  related to above   Empiric antibiotics prescribed due to symptom duration greater than 7 days and progression despite OTC symptomatic care Prescription cough suppression - new prescriptions done Symptomatic care with Tylenol or Advil, decongestants, antihistamine, hydration and rest -  Saline irrigation and salt gargle advised as needed

## 2012-01-05 NOTE — Assessment & Plan Note (Signed)
Lab Results  Component Value Date   HGBA1C 8.4* 11/24/2011   Not checking cbg's at home Will avoid systemic steroids at this time due to same The patient is asked to make an attempt to improve diet and exercise patterns to aid in medical management of this problem.

## 2012-01-05 NOTE — Assessment & Plan Note (Signed)
BP Readings from Last 3 Encounters:  01/05/12 132/92  11/24/11 110/80  10/04/11 136/90   The current medical regimen is generally effective;  continue present plan and medications.  Advised to avoid decongestant use >5 days

## 2012-01-05 NOTE — Patient Instructions (Signed)
It was good to see you today.  Zpak antibiotics and prescription cough syrup - Your prescription(s) have been submitted to your pharmacy. Please take as directed and contact our office if you believe you are having problem(s) with the medication(s).  Alternate between ibuprofen and tylenol for aches, pain and fever symptoms as discussed  Hydrate, rest and call if worse or unimproved 

## 2012-02-09 ENCOUNTER — Encounter: Payer: Self-pay | Admitting: Internal Medicine

## 2012-02-09 ENCOUNTER — Ambulatory Visit (INDEPENDENT_AMBULATORY_CARE_PROVIDER_SITE_OTHER): Payer: BC Managed Care – PPO | Admitting: Internal Medicine

## 2012-02-09 ENCOUNTER — Ambulatory Visit: Payer: BC Managed Care – PPO | Admitting: Internal Medicine

## 2012-02-09 VITALS — BP 132/92 | HR 103 | Temp 98.1°F

## 2012-02-09 DIAGNOSIS — J309 Allergic rhinitis, unspecified: Secondary | ICD-10-CM

## 2012-02-09 MED ORDER — CETIRIZINE HCL 10 MG PO TABS: 10.0000 mg | ORAL_TABLET | Freq: Every day | ORAL | Status: AC

## 2012-02-09 MED ORDER — FLUTICASONE PROPIONATE 50 MCG/ACT NA SUSP: 2.0000 | Freq: Every day | NASAL | Status: AC

## 2012-02-09 NOTE — Progress Notes (Signed)
HPI  Pt presents to the clinic today with c/o cold symptoms x 2 weeks. He has itchy watery eyes and a runny nose. He has taken sudafed without relief. He is concerned that he has a sinus infection. He denies headache, facial pain and pressure. He has not had sick contacts. He does have a history of allergies.  Review of Systems      Past Medical History  Diagnosis Date  . HYPERLIPIDEMIA   . ERECTILE DYSFUNCTION   . ADHD   . DISC DISEASE, LUMBAR   . FRACTURE, RIGHT LEG   . DM2 (diabetes mellitus, type 2)   . Hypertension     Family History  Problem Relation Age of Onset  . Benign prostatic hyperplasia Father   . Heart disease Father     mature heart disease  . Hypertension Other     History   Social History  . Marital Status: Married    Spouse Name: N/A    Number of Children: N/A  . Years of Education: N/A   Occupational History  . [employer] Human resources officer   Social History Main Topics  . Smoking status: Never Smoker   . Smokeless tobacco: Not on file  . Alcohol Use: Yes     Comment: rare at the superbow[employer] or summer picnics  . Drug Use: Not on file  . Sexually Active: Not on file   Other Topics Concern  . Not on file   Social History Narrative  . No narrative on file    Allergies  Allergen Reactions  . Levofloxacin Nausea Only  . Lisinopril Other (See Comments)    Dizzy, confused, nausea, palp's and sweats     Constitutional: Denies headache, fever or fatigue orabrupt weight changes.  HEENT:  Positive itchy eyes and runny nose. Denies eye redness, eye pain, pressure behind the eyes, facial pain, nasal congestion, ear pain, ringing in the ears, wax buildup, or bloody nose. Respiratory: . Denies cough,  difficulty breathing or shortness of breath.  Cardiovascular: Denies chest pain, chest tightness, palpitations or swelling in the hands or feet.   No other specific complaints in a complete review of systems (except as listed in HPI  above).  Objective:   BP 132/92  Pulse 103  Temp 98.1 F (36.7 C) (Oral)  SpO2 98% Wt Readings from Last 3 Encounters:  01/05/12 188 lb 3.2 oz (85.367 kg)  11/24/11 189 lb 8 oz (85.957 kg)  10/04/11 194 lb 5 oz (88.14 kg)     General: Appears his stated age, well developed, well nourished in NAD. HEENT: Head: normal shape and size; Eyes: sclera white, no icterus, conjunctiva pink, PERRLA and EOMs intact; Ears: Tm's gray and intact, normal light reflex; Nose: mucosa pink and moist, septum midline; Throat/Mouth: + PND. Teeth present, mucosa erythematous and moist, no exudate noted, no lesions or ulcerations noted.  Neck:  Neck supple, trachea midline. No massses, lumps or thyromegaly present.  Cardiovascular: Normal rate and rhythm. S1,S2 noted.  No murmur, rubs or gallops noted. No JVD or BLE edema. No carotid bruits noted. Pulmonary/Chest: Normal effort and positive vesicular breath sounds. No respiratory distress. No wheezes, rales or ronchi noted.      Assessment & Plan:  Allergic Rhiniosinusitis, new onset with additional workup required:  Get some rest and drink plenty of water eRx for Zyrtec 10 mg daily eR for flonase daily  RTC as needed or if symptoms persist.

## 2012-02-09 NOTE — Patient Instructions (Signed)
Indoor Allergies House dust often contains a mixture of tiny particles that commonly cause allergic symptoms. These include dust mites, cockroaches, fungi spores (mold) and animal dander.   DUST MITES Dust mites are so tiny that they cannot be seen with the naked eye (microscopic). They are relatives of the spider. They live on mattresses, pillows, bedding, upholstered furniture, carpets and curtains. These tiny creatures feed on skin flakes that people and pets shed daily. They commonly float around in the dust in your home when vacuuming or when bedding is disturbed. The air-born dust mites often cause runny noses and symptoms of asthma. The problems are similar to a pollen allergy. These mites thrive in summer and die in winter. In a warm, humid house, however, they continue to thrive even in the coldest months. The particles seen floating in a shaft of sunlight include dead dust mites and their waste-products. These waste-products, which are proteins, cause the allergic reaction. Even in the cleanest home, dust mites still exist. This is because typical cleaning methods cannot eliminate many of the dust particles.   COCKROACHES Cockroach allergy is primarily caused by their droppings. It is found in house dust, especially in older homes. MOLD Mold is very often found in homes and house dust, and when in high concentrations may become harmful, especially for people allergic to mold. They tend to grow faster in the presence of moisture. ANIMAL DANDER Pets (furred animals) can cause allergies too. This is not caused by the fur, but from the proteins in their skin, saliva and urine. These proteins are called allergens. The dander (skin scales) is the source of most pet allergies. Therefore, short-haired animals can cause allergies as much as Soil scientist. Dander and saliva are the source of cat and dog allergens. Urine is the source of allergens from rabbits, hamsters, mice and [place]  pigs. PREVENTION Dust mites  Use a dehumidifier or air conditioner to keep the humidity low (50% or below).   Cover your mattress and pillows in dust-proof or allergen resistant covers.   Wash all bedding and blankets once a week in hot water (at least 130 - 140F). Non-washable bedding can be frozen overnight to kill dust mites.   Replace wool or feathered bedding with synthetic materials and traditional stuffed animals with washable ones.   If possible, replace wall-to-wall carpets in bedrooms with bare floors (linoleum, tile or wood). Remove fabric curtains and upholstered furniture.   Use a damp mop or rag to remove dust. Do not use a dry cloth since this stirs up mite allergens.   Use a vacuum cleaner with either a double-layered micro filter bag or a HEPA filter. These filters trap allergens that pass through a vacuum's exhaust.   Wear a mask while vacuuming to avoid inhaling allergens. Stay out of the vacuumed area for 20 minutes while dust and allergens settle.   Use only high efficiency media filters for your furnace and air-conditioning, preferably with a MERV rating of 11 or 12. In order to maintain a clean filter, remember to change it at least every three months.  Cockroaches  Control cockroaches by eliminating their entrance to the home and by eliminating their food sources.   Block crevices and cracks and remove water sources such as leaky faucets and pipes.   Keep food out of the open when finished eating. This also includes pet food. Sealed containers for food work well. Remove crumbs that may have accumulated such as in a toaster.   Use garbage  containers that have lids and immediately clean off counters, tables and stove tops.   An exterminator might be helpful as well.  Mold  Control mold by eliminating moisture and dampness.   Repair leaks around the home including the roof and pipes.   For high humid areas consider using dehumidifiers. Rooms with the most  moisture include kitchens, bathrooms and basements.   Ventilation and cleaning are also important.   Detergent or 5% bleach can be used to clean off mold from hard surfaces. It is important not to mix bleach with other products and to dry the area completely after cleaning.   For more extensive mold problems hire an Gaffer.   For mold on clothing, soap and water work best. If they cannot be cleaned throw them out.  Animal dander  Control pet dander by removing pets from your home. If this is not an option then try lessen the contact by keeping the pet out of areas that you spend most of your time, such as the bedroom.   Vacuum often and consider replacing carpet with a hardwood floor, tile or linoleum.   A HEPA air cleaner may also help to reduce the level of animal allergen in the air.  Document Released: 12/04/2003 Document Revised: 03/27/2011 Document Reviewed: 04/14/2008 [facility] Patient Information 2013 [place], [place].

## 2012-05-24 ENCOUNTER — Encounter: Payer: BC Managed Care – PPO | Admitting: Internal Medicine

## 2012-08-02 ENCOUNTER — Other Ambulatory Visit: Payer: Self-pay | Admitting: Internal Medicine

## 2012-10-10 ENCOUNTER — Other Ambulatory Visit: Payer: Self-pay | Admitting: Internal Medicine

## 2012-10-10 ENCOUNTER — Encounter: Payer: BC Managed Care – PPO | Admitting: Internal Medicine

## 2012-10-18 ENCOUNTER — Ambulatory Visit (INDEPENDENT_AMBULATORY_CARE_PROVIDER_SITE_OTHER): Payer: BC Managed Care – PPO | Admitting: Internal Medicine

## 2012-10-18 ENCOUNTER — Encounter: Payer: Self-pay | Admitting: Internal Medicine

## 2012-10-18 ENCOUNTER — Encounter: Payer: BC Managed Care – PPO | Admitting: Internal Medicine

## 2012-10-18 ENCOUNTER — Other Ambulatory Visit (INDEPENDENT_AMBULATORY_CARE_PROVIDER_SITE_OTHER): Payer: Self-pay

## 2012-10-18 VITALS — BP 120/80 | HR 84 | Temp 97.5°F | Ht 69.0 in | Wt 185.0 lb

## 2012-10-18 DIAGNOSIS — Z0000 Encounter for general adult medical examination without abnormal findings: Secondary | ICD-10-CM

## 2012-10-18 DIAGNOSIS — E119 Type 2 diabetes mellitus without complications: Secondary | ICD-10-CM

## 2012-10-18 LAB — BASIC METABOLIC PANEL
CO2: 30 mEq/L (ref 19–32)
Calcium: 9.4 mg/dL (ref 8.4–10.5)
Chloride: 106 mEq/L (ref 96–112)
Glucose, Bld: 122 mg/dL — ABNORMAL HIGH (ref 70–99)
Sodium: 138 mEq/L (ref 135–145)

## 2012-10-18 LAB — URINALYSIS, ROUTINE W REFLEX MICROSCOPIC
Hgb urine dipstick: NEGATIVE
Leukocytes, UA: NEGATIVE
Nitrite: NEGATIVE
Specific Gravity, Urine: 1.02 (ref 1.000–1.030)
Urobilinogen, UA: 1 (ref 0.0–1.0)

## 2012-10-18 LAB — CBC WITH DIFFERENTIAL/PLATELET
Basophils Absolute: 0 10*3/uL (ref 0.0–0.1)
Eosinophils Absolute: 0.1 10*3/uL (ref 0.0–0.7)
Eosinophils Relative: 1.1 % (ref 0.0–5.0)
HCT: 38.9 % — ABNORMAL LOW (ref 39.0–52.0)
Lymphocytes Relative: 17.4 % (ref 12.0–46.0)
MCHC: 34.2 g/dL (ref 30.0–36.0)
Monocytes Absolute: 0.4 10*3/uL (ref 0.1–1.0)
Neutrophils Relative %: 76.3 % (ref 43.0–77.0)
Platelets: 156 10*3/uL (ref 150.0–400.0)
RBC: 4.51 Mil/uL (ref 4.22–5.81)
RDW: 13.1 % (ref 11.5–14.6)
WBC: 8.9 10*3/uL (ref 4.5–10.5)

## 2012-10-18 LAB — LIPID PANEL
HDL: 47.8 mg/dL (ref >39.00)
LDL Cholesterol: 64 mg/dL (ref 0–99)
Total CHOL/HDL Ratio: 3

## 2012-10-18 LAB — HEPATIC FUNCTION PANEL
ALT: 16 U/L (ref 0–53)
AST: 25 U/L (ref 0–37)
Alkaline Phosphatase: 50 U/L (ref 39–117)
Bilirubin, Direct: 0.3 mg/dL (ref 0.0–0.3)
Total Protein: 7.1 g/dL (ref 6.0–8.3)

## 2012-10-18 LAB — HEMOGLOBIN A1C: Hgb A1c MFr Bld: 5.7 % (ref 4.6–6.5)

## 2012-10-18 LAB — MICROALBUMIN / CREATININE URINE RATIO: Microalb Creat Ratio: 0.8 mg/g (ref 0.0–30.0)

## 2012-10-18 MED ORDER — ATORVASTATIN CALCIUM 80 MG PO TABS: 80.0000 mg | ORAL_TABLET | Freq: Every day | ORAL | Status: DC

## 2012-10-18 MED ORDER — NAPROXEN 500 MG PO TBEC: 500.0000 mg | DELAYED_RELEASE_TABLET | Freq: Two times a day (BID) | ORAL | Status: AC

## 2012-10-18 NOTE — Assessment & Plan Note (Signed)

## 2012-10-18 NOTE — Patient Instructions (Signed)
Please continue all other medications as before, and refills have been done if requested. No changes at this time Please continue your efforts at being more active, low cholesterol diet, and weight control. You are otherwise up to date with prevention measures today. Please go to the LAB in the Basement (turn left off the elevator) for the tests to be done today You will be contacted by phone if any changes need to be made immediately.  Otherwise, you will receive a letter about your results with an explanation, but please check with MyChart first.  Please remember to sign up for My Chart if you have not done so, as this will be important to you in the future with finding out test results, communicating by private email, and scheduling acute appointments online when needed.  Please return in 6 months, or sooner if needed, with Lab testing done 3-5 days before

## 2012-10-18 NOTE — Addendum Note (Signed)
Addended by: Scharlene Gloss B on: 10/18/2012 02:39 PM   Modules accepted: Orders

## 2012-10-18 NOTE — Progress Notes (Signed)
Subjective:    Patient ID: Jonathan Heath, male    DOB: 1955-06-15, 57 y.o.   MRN: [id number]  HPI  Here for wellness and f/u;  Overall doing ok;  Pt denies CP, worsening SOB, DOE, wheezing, orthopnea, PND, worsening LE edema, palpitations, dizziness or syncope.  Pt denies neurological change such as new headache, facial or extremity weakness.  Pt denies polydipsia, polyuria, or low sugar symptoms. Pt states overall good compliance with treatment and medications, good tolerability, and has been trying to follow lower cholesterol diet.  Pt denies worsening depressive symptoms, suicidal ideation or panic. No fever, night sweats, wt loss, loss of appetite, or other constitutional symptoms.  Pt states good ability with ADL's, has low fall risk, home safety reviewed and adequate, no other significant changes in hearing or vision, and only occasionally active with exercise.  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  Trying to get [employer], but signup window not open yet.  Self insured today.  Does check sugars 3-4 times per wk Past Medical History  Diagnosis Date  . HYPERLIPIDEMIA   . ERECTILE DYSFUNCTION   . ADHD   . DISC DISEASE, LUMBAR   . FRACTURE, RIGHT LEG   . DM2 (diabetes mellitus, type 2)   . Hypertension    Past Surgical History  Procedure Laterality Date  . Hx fx right leg s/p orif      reports that he has never smoked. He does not have any smokeless tobacco history on file. He reports that  drinks alcohol. His drug history is not on file. family history includes Benign prostatic hyperplasia in his father; Heart disease in his father; Hypertension in his other. Allergies  Allergen Reactions  . Levofloxacin Nausea Only  . Lisinopril Other (See Comments)    Dizzy, confused, nausea, palp's and sweats   Current Outpatient Prescriptions on File Prior to Visit  Medication Sig Dispense Refill   . albuterol (PROVENTIL HFA;VENTOLIN HFA) 108 (90 BASE) MCG/ACT inhaler Inhale 2 puffs into the lungs every 6 (six) hours as needed.      Marland Kitchen aspirin 81 MG EC tablet Take 81 mg by mouth daily.        Marland Kitchen atorvastatin (LIPITOR) 80 MG tablet Take 1 tablet (80 mg total) by mouth daily.  90 tablet  3  . cetirizine (ZYRTEC) 10 MG tablet Take 1 tablet (10 mg total) by mouth daily.  30 tablet  3  . cyclobenzaprine (FLEXERIL) 5 MG tablet Take 1 tablet (5 mg total) by mouth 3 (three) times daily as needed for muscle spasms.  60 tablet  1  . divalproex (DEPAKOTE) 250 MG 24 hr tablet Take 250 mg by mouth daily.        . fluticasone (FLONASE) 50 MCG/ACT nasal spray Place 2 sprays into the nose daily.  16 g  6  . metFORMIN (GLUCOPHAGE-XR) 500 MG 24 hr tablet TAKE 2 TABLETS BY MOUTH DAILY WITH BREAKFAST  180 tablet  3  . methylphenidate (RITALIN) 20 MG tablet Take 20 mg by mouth. 1 by mouth two times a day every morning and at noon       . naproxen (EC NAPROSYN) 500 MG EC tablet Take 500 mg by mouth 2 (two) times daily.      . ONE TOUCH ULTRA TEST test strip USE AS DIRECTED  300 each  3  . sildenafil (VIAGRA) 100 MG tablet Take 50-100 mg  by mouth daily as needed.      . traMADol (ULTRAM) 50 MG tablet Take 1 tablet (50 mg total) by mouth every 6 (six) hours as needed for pain.  60 tablet  1  . triamcinolone (KENALOG) 0.1 % cream Apply topically 2 (two) times daily.         No current facility-administered medications on file prior to visit.   Review of Systems Constitutional: Negative for diaphoresis, activity change, appetite change or unexpected weight change.  HENT: Negative for hearing loss, ear pain, facial swelling, mouth sores and neck stiffness.   Eyes: Negative for pain, redness and visual disturbance.  Respiratory: Negative for shortness of breath and wheezing.   Cardiovascular: Negative for chest pain and palpitations.  Gastrointestinal: Negative for diarrhea, blood in stool, abdominal distention or  other pain Genitourinary: Negative for hematuria, flank pain or change in urine volume.  Musculoskeletal: Negative for myalgias and joint swelling.  Skin: Negative for color change and wound.  Neurological: Negative for syncope and numbness. other than noted Hematological: Negative for adenopathy.  Psychiatric/Behavioral: Negative for hallucinations, self-injury, decreased concentration and agitation.      Objective:   Physical Exam BP 120/80  Pulse 84  Temp(Src) 97.5 F (36.4 C) (Oral)  Ht 5\' 9"  (1.753 m)  Wt 185 lb (83.915 kg)  BMI 27.31 kg/m2  SpO2 99% VS noted,  Constitutional: Pt is oriented to person, place, and time. Appears well-developed and well-nourished.  Head: Normocephalic and atraumatic.  Right Ear: External ear normal.  Left Ear: External ear normal.  Nose: Nose normal.  Mouth/Throat: Oropharynx is clear and moist.  Eyes: Conjunctivae and EOM are normal. Pupils are equal, round, and reactive to light.  Neck: Normal range of motion. Neck supple. No JVD present. No tracheal deviation present.  Cardiovascular: Normal rate, regular rhythm, normal heart sounds and intact distal pulses.   Pulmonary/Chest: Effort normal and breath sounds normal.  Abdominal: Soft. Bowel sounds are normal. There is no tenderness. No HSM  Musculoskeletal: Normal range of motion. Exhibits no edema.  Lymphadenopathy:  Has no cervical adenopathy.  Neurological: Pt is alert and oriented to person, place, and time. Pt has normal reflexes. No cranial nerve deficit.  Skin: Skin is warm and dry. No rash noted.  Psychiatric:  Has  normal mood and affect. Behavior is normal.     Assessment & Plan:

## 2012-10-18 NOTE — Assessment & Plan Note (Signed)
stable overall by history and exam, recent data reviewed with pt, and pt to continue medical treatment as before,  to f/u any worsening symptoms or concerns For a1c today

## 2012-10-21 LAB — TSH: TSH: 0.92 u[IU]/mL (ref 0.35–5.50)

## 2013-04-25 ENCOUNTER — Ambulatory Visit: Payer: Self-pay | Admitting: Internal Medicine

## 2013-05-01 ENCOUNTER — Ambulatory Visit: Payer: Self-pay | Admitting: Internal Medicine

## 2013-05-22 ENCOUNTER — Ambulatory Visit (INDEPENDENT_AMBULATORY_CARE_PROVIDER_SITE_OTHER): Payer: BC Managed Care – PPO | Admitting: Internal Medicine

## 2013-05-22 ENCOUNTER — Encounter: Payer: Self-pay | Admitting: Internal Medicine

## 2013-05-22 ENCOUNTER — Other Ambulatory Visit (INDEPENDENT_AMBULATORY_CARE_PROVIDER_SITE_OTHER): Payer: BC Managed Care – PPO

## 2013-05-22 VITALS — BP 130/84 | HR 87 | Temp 98.1°F | Ht 69.0 in | Wt 192.2 lb

## 2013-05-22 DIAGNOSIS — E785 Hyperlipidemia, unspecified: Secondary | ICD-10-CM

## 2013-05-22 DIAGNOSIS — E119 Type 2 diabetes mellitus without complications: Secondary | ICD-10-CM

## 2013-05-22 DIAGNOSIS — I10 Essential (primary) hypertension: Secondary | ICD-10-CM

## 2013-05-22 DIAGNOSIS — Z0000 Encounter for general adult medical examination without abnormal findings: Secondary | ICD-10-CM

## 2013-05-22 LAB — CBC WITH DIFFERENTIAL/PLATELET
Basophils Absolute: 0 10*3/uL (ref 0.0–0.1)
Basophils Relative: 0.4 % (ref 0.0–3.0)
EOS ABS: 0.1 10*3/uL (ref 0.0–0.7)
Eosinophils Relative: 1.7 % (ref 0.0–5.0)
HCT: 40.6 % (ref 39.0–52.0)
HEMOGLOBIN: 13.7 g/dL (ref 13.0–17.0)
LYMPHS ABS: 1.5 10*3/uL (ref 0.7–4.0)
Lymphocytes Relative: 18.4 % (ref 12.0–46.0)
MCHC: 33.8 g/dL (ref 30.0–36.0)
MCV: 88.4 fl (ref 78.0–100.0)
Monocytes Absolute: 0.4 10*3/uL (ref 0.1–1.0)
Monocytes Relative: 4.9 % (ref 3.0–12.0)
Neutro Abs: 6.1 10*3/uL (ref 1.4–7.7)
Neutrophils Relative %: 74.6 % (ref 43.0–77.0)
Platelets: 159 10*3/uL (ref 150.0–400.0)
RBC: 4.59 Mil/uL (ref 4.22–5.81)
RDW: 13 % (ref 11.5–15.5)
WBC: 8.1 10*3/uL (ref 4.0–10.5)

## 2013-05-22 LAB — MICROALBUMIN / CREATININE URINE RATIO
CREATININE, U: 130.9 mg/dL
Microalb Creat Ratio: 0.3 mg/g (ref 0.0–30.0)
Microalb, Ur: 0.4 mg/dL (ref 0.0–1.9)

## 2013-05-22 LAB — URINALYSIS, ROUTINE W REFLEX MICROSCOPIC
BILIRUBIN URINE: NEGATIVE
Hgb urine dipstick: NEGATIVE
Ketones, ur: NEGATIVE
Leukocytes, UA: NEGATIVE
Nitrite: NEGATIVE
RBC / HPF: NONE SEEN (ref 0–?)
Specific Gravity, Urine: 1.02 (ref 1.000–1.030)
TOTAL PROTEIN, URINE-UPE24: NEGATIVE
Urine Glucose: 500 — AB
Urobilinogen, UA: 1 (ref 0.0–1.0)
WBC, UA: NONE SEEN (ref 0–?)
pH: 6 (ref 5.0–8.0)

## 2013-05-22 LAB — LIPID PANEL
CHOL/HDL RATIO: 6
Cholesterol: 247 mg/dL — ABNORMAL HIGH (ref 0–200)
HDL: 43.4 mg/dL (ref >39.00)
LDL Cholesterol: 166 mg/dL — ABNORMAL HIGH (ref 0–99)
TRIGLYCERIDES: 189 mg/dL — AB (ref 0.0–149.0)
VLDL: 37.8 mg/dL (ref 0.0–40.0)

## 2013-05-22 LAB — BASIC METABOLIC PANEL
BUN: 10 mg/dL (ref 6–23)
CHLORIDE: 100 meq/L (ref 96–112)
CO2: 30 meq/L (ref 19–32)
CREATININE: 1.2 mg/dL (ref 0.4–1.5)
Calcium: 9.4 mg/dL (ref 8.4–10.5)
GFR: 78.42 mL/min (ref >60.00)
Glucose, Bld: 239 mg/dL — ABNORMAL HIGH (ref 70–99)
Potassium: 4 mEq/L (ref 3.5–5.1)
Sodium: 136 mEq/L (ref 135–145)

## 2013-05-22 LAB — HEPATIC FUNCTION PANEL
ALT: 19 U/L (ref 0–53)
AST: 21 U/L (ref 0–37)
Albumin: 4 g/dL (ref 3.5–5.2)
Alkaline Phosphatase: 73 U/L (ref 39–117)
Bilirubin, Direct: 0.2 mg/dL (ref 0.0–0.3)
TOTAL PROTEIN: 7.1 g/dL (ref 6.0–8.3)
Total Bilirubin: 1.6 mg/dL — ABNORMAL HIGH (ref 0.2–1.2)

## 2013-05-22 LAB — HEMOGLOBIN A1C: Hgb A1c MFr Bld: 7.2 % — ABNORMAL HIGH (ref 4.6–6.5)

## 2013-05-22 LAB — TSH: TSH: 1.21 u[IU]/mL (ref 0.35–4.50)

## 2013-05-22 LAB — PSA: PSA: 0.85 ng/mL (ref 0.10–4.00)

## 2013-05-22 NOTE — Assessment & Plan Note (Signed)
stable overall by history and exam, recent data reviewed with pt, and pt to continue medical treatment as before,  to f/u any worsening symptoms or concerns Lab Results  Component Value Date   LDLCALC 64 10/18/2012

## 2013-05-22 NOTE — Patient Instructions (Signed)
Please continue all other medications as before, and refills have been done if requested. Please have the pharmacy call with any other refills you may need.  Please continue your efforts at being more active, low cholesterol diet, and weight control.  You are otherwise up to date with prevention measures today.  Please go to the LAB in the Basement (turn left off the elevator) for the tests to be done today  You will be contacted by phone if any changes need to be made immediately.  Otherwise, you will receive a letter about your results with an explanation, but please check with MyChart first.  Please return in 6 months, or sooner if needed, with Lab testing done 3-5 days before

## 2013-05-22 NOTE — Assessment & Plan Note (Signed)
stable overall by history and exam, recent data reviewed with pt, and pt to continue medical treatment as before,  to f/u any worsening symptoms or concerns BP Readings from Last 3 Encounters:  05/22/13 130/84  10/18/12 120/80  02/09/12 132/92

## 2013-05-22 NOTE — Assessment & Plan Note (Signed)
stable overall by history and exam, recent data reviewed with pt, and pt to continue medical treatment as before,  to f/u any worsening symptoms or concerns Lab Results  Component Value Date   HGBA1C 5.7 10/18/2012

## 2013-05-22 NOTE — Progress Notes (Signed)
Subjective:    Patient ID: Jonathan Heath, male    DOB: 10-Sep-1955, 58 y.o.   MRN: [id number]  HPI    Here to f/u; overall doing ok,  Pt denies chest pain, increased sob or doe, wheezing, orthopnea, PND, increased LE swelling, palpitations, dizziness or syncope.  Pt denies polydipsia, polyuria, or low sugar symptoms such as weakness or confusion improved with po intake.  Pt denies new neurological symptoms such as new headache, or facial or extremity weakness or numbness.   Pt states overall good compliance with meds, has been trying to follow lower cholesterol, diabetic diet, with wt overall stable,  but little exercise however.  No current complaints Past Medical History  Diagnosis Date  . HYPERLIPIDEMIA   . ERECTILE DYSFUNCTION   . ADHD   . DISC DISEASE, LUMBAR   . FRACTURE, RIGHT LEG   . DM2 (diabetes mellitus, type 2)   . Hypertension    Past Surgical History  Procedure Laterality Date  . Hx fx right leg s/p orif      reports that he has never smoked. He does not have any smokeless tobacco history on file. He reports that he drinks alcohol. His drug history is not on file. family history includes Benign prostatic hyperplasia in his father; Heart disease in his father; Hypertension in his other. Allergies  Allergen Reactions  . Levofloxacin Nausea Only  . Lisinopril Other (See Comments)    Dizzy, confused, nausea, palp's and sweats   Current Outpatient Prescriptions on File Prior to Visit  Medication Sig Dispense Refill  . albuterol (PROVENTIL HFA;VENTOLIN HFA) 108 (90 BASE) MCG/ACT inhaler Inhale 2 puffs into the lungs every 6 (six) hours as needed.      Marland Kitchen. aspirin 81 MG EC tablet Take 81 mg by mouth daily.        Marland Kitchen. atorvastatin (LIPITOR) 80 MG tablet Take 1 tablet (80 mg total) by mouth daily.  90 tablet  3  . cetirizine (ZYRTEC) 10 MG tablet Take 1 tablet (10 mg total) by mouth daily.  30 tablet  3  . cyclobenzaprine (FLEXERIL) 5 MG tablet Take 1 tablet (5 mg total) by mouth  3 (three) times daily as needed for muscle spasms.  60 tablet  1  . divalproex (DEPAKOTE) 250 MG 24 hr tablet Take 250 mg by mouth daily.        . fluticasone (FLONASE) 50 MCG/ACT nasal spray Place 2 sprays into the nose daily.  16 g  6  . metFORMIN (GLUCOPHAGE-XR) 500 MG 24 hr tablet TAKE 2 TABLETS BY MOUTH DAILY WITH BREAKFAST  180 tablet  3  . methylphenidate (RITALIN) 20 MG tablet Take 20 mg by mouth. 1 by mouth two times a day every morning and at noon       . naproxen (EC NAPROSYN) 500 MG EC tablet Take 1 tablet (500 mg total) by mouth 2 (two) times daily.  180 tablet  3  . ONE TOUCH ULTRA TEST test strip USE AS DIRECTED  300 each  3  . sildenafil (VIAGRA) 100 MG tablet Take 50-100 mg by mouth daily as needed.      . traMADol (ULTRAM) 50 MG tablet Take 1 tablet (50 mg total) by mouth every 6 (six) hours as needed for pain.  60 tablet  1  . triamcinolone (KENALOG) 0.1 % cream Apply topically 2 (two) times daily.         No current facility-administered medications on file prior to visit.   Review  of Systems  Constitutional: Negative for unusual diaphoresis or other sweats  HENT: Negative for ringing in ear Eyes: Negative for double vision or worsening visual disturbance.  Respiratory: Negative for choking and stridor.   Gastrointestinal: Negative for vomiting or other signifcant bowel change Genitourinary: Negative for hematuria or decreased urine volume.  Musculoskeletal: Negative for other MSK pain or swelling Skin: Negative for color change and worsening wound.  Neurological: Negative for tremors and numbness other than noted  Psychiatric/Behavioral: Negative for decreased concentration or agitation other than above       Objective:   Physical Exam /BP 130/84  Pulse 87  Temp(Src) 98.1 F (36.7 C) (Oral)  Ht 5\' 9"  (1.753 m)  Wt 192 lb 4 oz (87.204 kg)  BMI 28.38 kg/m2  SpO2 95% VS noted,  Constitutional: Pt appears well-developed, well-nourished.  HENT: Head: NCAT.    Right Ear: External ear normal.  Left Ear: External ear normal.  Eyes: . Pupils are equal, round, and reactive to light. Conjunctivae and EOM are normal Neck: Normal range of motion. Neck supple.  Cardiovascular: Normal rate and regular rhythm.   Pulmonary/Chest: Effort normal and breath sounds normal.  Neurological: Pt is alert. Not confused , motor grossly intact Skin: Skin is warm. No rash Psychiatric: Pt behavior is normal. No agitation.     Assessment & Plan:

## 2013-05-22 NOTE — Progress Notes (Signed)
Pre visit review using our clinic review tool, if applicable. No additional management support is needed unless otherwise documented below in the visit note. 

## 2013-08-20 ENCOUNTER — Ambulatory Visit: Payer: BC Managed Care – PPO | Admitting: Family Medicine

## 2013-08-28 ENCOUNTER — Ambulatory Visit: Payer: BC Managed Care – PPO | Admitting: Family Medicine

## 2013-08-28 DIAGNOSIS — Z0289 Encounter for other administrative examinations: Secondary | ICD-10-CM

## 2013-08-29 ENCOUNTER — Telehealth: Payer: Self-pay | Admitting: Family Medicine

## 2013-08-29 NOTE — Telephone Encounter (Signed)
Patient no showed for new patient appt 08/28/2013.  Please advise.

## 2013-08-29 NOTE — Telephone Encounter (Signed)
Noted  

## 2013-09-11 ENCOUNTER — Ambulatory Visit (INDEPENDENT_AMBULATORY_CARE_PROVIDER_SITE_OTHER)
Admission: RE | Admit: 2013-09-11 | Discharge: 2013-09-11 | Disposition: A | Discharge status: Home / Self Care | Payer: BC Managed Care – PPO | Source: Ambulatory Visit | Attending: Family Medicine | Admitting: Family Medicine

## 2013-09-11 ENCOUNTER — Other Ambulatory Visit (INDEPENDENT_AMBULATORY_CARE_PROVIDER_SITE_OTHER): Payer: BC Managed Care – PPO

## 2013-09-11 ENCOUNTER — Ambulatory Visit (INDEPENDENT_AMBULATORY_CARE_PROVIDER_SITE_OTHER): Payer: BC Managed Care – PPO | Admitting: Family Medicine

## 2013-09-11 ENCOUNTER — Encounter: Payer: Self-pay | Admitting: Family Medicine

## 2013-09-11 VITALS — BP 140/80 | HR 87 | Ht 69.0 in | Wt 190.0 lb

## 2013-09-11 DIAGNOSIS — M25511 Pain in right shoulder: Secondary | ICD-10-CM

## 2013-09-11 DIAGNOSIS — M7551 Bursitis of right shoulder: Secondary | ICD-10-CM

## 2013-09-11 DIAGNOSIS — M25519 Pain in unspecified shoulder: Secondary | ICD-10-CM

## 2013-09-11 DIAGNOSIS — M999 Biomechanical lesion, unspecified: Secondary | ICD-10-CM

## 2013-09-11 DIAGNOSIS — M7550 Bursitis of unspecified shoulder: Secondary | ICD-10-CM | POA: Insufficient documentation

## 2013-09-11 DIAGNOSIS — IMO0002 Reserved for concepts with insufficient information to code with codable children: Secondary | ICD-10-CM

## 2013-09-11 DIAGNOSIS — M75100 Unspecified rotator cuff tear or rupture of unspecified shoulder, not specified as traumatic: Secondary | ICD-10-CM

## 2013-09-11 NOTE — Assessment & Plan Note (Signed)
Patient was given an injection today. In addition to this we will rule out any cervical pathology with an x-ray today. Patient also will do a home exercise program. We discussed icing protocol. Patient will use over-the-counter medications as needed. Patient and will come back and see me again in 3-4 weeks for further evaluation and treatment.

## 2013-09-11 NOTE — Progress Notes (Signed)
[facility] [address] [place], [place] [address] Phone: [phone] Subjective:    I'm seeing this patient by the request  of: Oliver Barre, MD    CC: Right shoulder pain  BJY:NWGNFAOZHY Jonathan Heath is a 58 y.o. male coming in with complaint of right shoulder pain. Patient has had this pain intermittently for approximately 2 years. Patient states recently he has started to have some increased frequency and duration the pain. Patient notices certain movements and give her trouble and if he lays on that side at night. Patient describes the pain is more of a 9/10 in severity and describes it as a dull aching sensation. Patient states he can radiate up towards his neck and sometimes can go down towards his hand but very minimal. Denies any numbness or any weakness. Patient states that he is still able to do all activities of daily living.     Past medical history, social, surgical and family history all reviewed in electronic medical record.   Review of Systems: No headache, visual changes, nausea, vomiting, diarrhea, constipation, dizziness, abdominal pain, skin rash, fevers, chills, night sweats, weight loss, swollen lymph nodes, body aches, joint swelling, muscle aches, chest pain, shortness of breath, mood changes.   Objective Blood pressure 140/80, pulse 87, height  (1.753 m), weight 190 lb (86.183 kg), SpO2 98.00%.  General: No apparent distress alert and oriented x3 mood and affect normal, dressed appropriately.  HEENT: Pupils equal, extraocular movements intact  Respiratory: Patient's speak in full sentences and does not appear short of breath  Cardiovascular: No lower extremity edema, non tender, no erythema  Skin: Warm dry intact with no signs of infection or rash on extremities or on axial skeleton.  Abdomen: Soft nontender  Neuro: Cranial nerves II through XII are intact, neurovascularly intact in all extremities with 2+ DTRs and 2+ pulses.    Lymph: No lymphadenopathy of posterior or anterior cervical chain or axillae bilaterally.  Gait normal with good balance and coordination.  MSK:  Non tender with full range of motion and good stability and symmetric strength and tone of  elbows, wrist, hip, knee and ankles bilaterally.  Shoulder: Right Inspection reveals no abnormalities, atrophy or asymmetry. Palpation is normal with no tenderness over AC joint or bicipital groove. ROM is full in all planes passively. Rotator cuff strength normal throughout. signs of impingement with positive Neer and Hawkin's tests, but negative empty can sign. Speeds and Yergason's tests normal. No labral pathology noted with negative Obrien's, negative clunk and good stability. Normal scapular function observed. No painful arc and no drop arm sign. No apprehension sign  MSK US performed of: Right This study was ordered, performed, and interpreted by Terrilee Files D.O.  Shoulder:   Supraspinatus:  Appears normal on long and transverse views, Bursal bulge seen with shoulder abduction on impingement view. Infraspinatus:  Appears normal on long and transverse views. Significant increase in Doppler flow Subscapularis:  Appears normal on long and transverse views. Positive bursa Teres Minor:  Appears normal on long and transverse views. AC joint:  Capsule undistended, no geyser sign. Glenohumeral Joint:  Appears normal without effusion. Glenoid Labrum:  Intact without visualized tears. Biceps Tendon:  Appears normal on long and transverse views, no fraying of tendon, tendon located in intertubercular groove, no subluxation with shoulder internal or external rotation.  Impression: Subacromial bursitis  Procedure: Real-time Ultrasound Guided Injection of right glenohumeral joint Device: GE Logiq E  Ultrasound guided injection is preferred  based studies that show increased duration, increased effect, greater accuracy, decreased procedural pain, increased  response rate with ultrasound guided versus blind injection.  Verbal informed consent obtained.  Time-out conducted.  Noted no overlying erythema, induration, or other signs of local infection.  Skin prepped in a sterile fashion.  Local anesthesia: Topical Ethyl chloride.  With sterile technique and under real time ultrasound guidance:  Joint visualized.  23g 1  inch needle inserted posterior approach. Pictures taken for needle placement. Patient did have injection of 2 cc of 1% lidocaine, 2 cc of 0.5% Marcaine, and 1.0 cc of Kenalog 40 mg/dL. Completed without difficulty  Pain immediately resolved suggesting accurate placement of the medication.  Advised to call if fevers/chills, erythema, induration, drainage, or persistent bleeding.  Images permanently stored and available for review in the ultrasound unit.  Impression: Technically successful ultrasound guided injection.  Osteopathic findings Elevated second rib on right    Impression and Recommendations:     This case required medical decision making of moderate complexity.

## 2013-09-11 NOTE — Assessment & Plan Note (Signed)
Decision today to treat with OMT was based on Physical Exam  After verbal consent patient was treated with HVLA techniques in cervical, rib areas  Patient tolerated the procedure well with improvement in symptoms  Patient given exercises, stretches and lifestyle modifications  See medications in patient instructions if given  Patient will follow up in 3 weeks

## 2013-09-11 NOTE — Patient Instructions (Addendum)
Good to meet you Ice 20 minutes 2 times daily. Usually after activity and before bed. Exercises 3 times a week.  Watch on your sleeping position, try to keep arm down.  tennisball between shoulder blades when you drive.  Aleve when you need it.  Come back in 3 weeks.

## 2013-10-02 ENCOUNTER — Ambulatory Visit: Payer: BC Managed Care – PPO | Admitting: Family Medicine

## 2013-10-22 ENCOUNTER — Other Ambulatory Visit: Payer: Self-pay | Admitting: Internal Medicine

## 2013-10-23 ENCOUNTER — Ambulatory Visit (INDEPENDENT_AMBULATORY_CARE_PROVIDER_SITE_OTHER): Payer: BC Managed Care – PPO | Admitting: Internal Medicine

## 2013-10-23 ENCOUNTER — Encounter: Payer: Self-pay | Admitting: Internal Medicine

## 2013-10-23 VITALS — BP 120/80 | HR 76 | Temp 98.0°F | Wt 187.5 lb

## 2013-10-23 DIAGNOSIS — I10 Essential (primary) hypertension: Secondary | ICD-10-CM

## 2013-10-23 DIAGNOSIS — R109 Unspecified abdominal pain: Secondary | ICD-10-CM

## 2013-10-23 MED ORDER — OXYCODONE HCL 5 MG PO TABS: ORAL_TABLET | ORAL | Status: AC

## 2013-10-23 NOTE — Assessment & Plan Note (Signed)
stable overall by history and exam, recent data reviewed with pt, and pt to continue medical treatment as before,  to f/u any worsening symptoms or concerns BP Readings from Last 3 Encounters:  10/23/13 120/80  09/11/13 140/80  05/22/13 130/84

## 2013-10-23 NOTE — Progress Notes (Signed)
Pre visit review using our clinic review tool, if applicable. No additional management support is needed unless otherwise documented below in the visit note. 

## 2013-10-23 NOTE — Progress Notes (Signed)
Subjective:    Patient ID: Jonathan Heath, male    DOB: Jan 06, 1956, 58 y.o.   MRN: [id number]  HPI Here with acute onset symptoms related to recurrent left flank pain episodes x 2 - C/o left flank pain first 3 days ago - "felt like someone shot me" acute onset after dinner out with seafood, no better with an OTC gas med, seemed to resolve in a few hrs but severe, did not seek medical attention.  Then recurred last night after dinner at [employer], took half of a wife 's tramadol, not a lot of help, went away again in a few hrs but quite severe and excruciating, no overt blood in the urine, Denies urinary symptoms such as dysuria, frequency, urgency, flank pain, hematuria or n/v, fever, chills.  No hx of renal stones, daughter with kidney stones. Pain worse than his previous leg fx. 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  No back or flank pain now.  Normal BM last night, no help or making worse, Denies worsening reflux, abd pain, dysphagia, n/v, bowel change or blood.   Past Medical History  Diagnosis Date  . HYPERLIPIDEMIA   . ERECTILE DYSFUNCTION   . ADHD   . DISC DISEASE, LUMBAR   . FRACTURE, RIGHT LEG   . DM2 (diabetes mellitus, type 2)   . Hypertension    Past Surgical History  Procedure Laterality Date  . Hx fx right leg s/p orif      reports that he has never smoked. He does not have any smokeless tobacco history on file. He reports that he drinks alcohol. His drug history is not on file. family history includes Benign prostatic hyperplasia in his father; Heart disease in his father; Hypertension in his other. Allergies  Allergen Reactions  . Levofloxacin Nausea Only  . Lisinopril Other (See Comments)    Dizzy, confused, nausea, palp's and sweats   Current Outpatient Prescriptions on File Prior to Visit  Medication Sig Dispense Refill  . albuterol (PROVENTIL HFA;VENTOLIN HFA) 108 (90 BASE) MCG/ACT inhaler Inhale 2 puffs into the lungs every 6 (six) hours  as needed.      Marland Kitchen aspirin 81 MG EC tablet Take 81 mg by mouth daily.        Marland Kitchen atorvastatin (LIPITOR) 80 MG tablet TAKE 1 TABLET BY MOUTH DAILY  90 tablet  2  . cetirizine (ZYRTEC) 10 MG tablet Take 1 tablet (10 mg total) by mouth daily.  30 tablet  3  . cyclobenzaprine (FLEXERIL) 5 MG tablet Take 1 tablet (5 mg total) by mouth 3 (three) times daily as needed for muscle spasms.  60 tablet  1  . divalproex (DEPAKOTE) 250 MG 24 hr tablet Take 250 mg by mouth daily.        . fluticasone (FLONASE) 50 MCG/ACT nasal spray Place 2 sprays into the nose daily.  16 g  6  . metFORMIN (GLUCOPHAGE-XR) 500 MG 24 hr tablet TAKE 2 TABLETS BY MOUTH DAILY WITH BREAKFAST  180 tablet  3  . methylphenidate (RITALIN) 20 MG tablet Take 20 mg by mouth. 1 by mouth two times a day every morning and at noon       . naproxen (EC NAPROSYN) 500 MG EC tablet Take 1 tablet (500 mg total) by mouth 2 (two) times daily.  180 tablet  3  . ONE TOUCH ULTRA TEST test strip USE AS DIRECTED  300 each  3  . sildenafil (VIAGRA) 100 MG tablet Take  50-100 mg by mouth daily as needed.      . traMADol (ULTRAM) 50 MG tablet Take 1 tablet (50 mg total) by mouth every 6 (six) hours as needed for pain.  60 tablet  1  . triamcinolone (KENALOG) 0.1 % cream Apply topically 2 (two) times daily.         No current facility-administered medications on file prior to visit.    Review of Systems  Constitutional: Negative for unusual diaphoresis or other sweats  HENT: Negative for ringing in ear Eyes: Negative for double vision or worsening visual disturbance.  Respiratory: Negative for choking and stridor.   Gastrointestinal: Negative for vomiting or other signifcant bowel change Genitourinary: Negative for hematuria or decreased urine volume.  Musculoskeletal: Negative for other MSK pain or swelling Skin: Negative for color change and worsening wound.  Neurological: Negative for tremors and numbness other than noted  Psychiatric/Behavioral:  Negative for decreased concentration or agitation other than above       Objective:   Physical Exam BP 120/80  Pulse 76  Temp(Src) 98 F (36.7 C) (Oral)  Wt 187 lb 8 oz (85.049 kg)  SpO2 98% VS noted, not ill appearing Constitutional: Pt appears well-developed, well-nourished.  HENT: Head: NCAT.  Right Ear: External ear normal.  Left Ear: External ear normal.  Eyes: . Pupils are equal, round, and reactive to light. Conjunctivae and EOM are normal Neck: Normal range of motion. Neck supple.  Cardiovascular: Normal rate and regular rhythm.   Pulmonary/Chest: Effort normal and breath sounds normal.  Abd:  Soft, NT, ND, + BS Neurological: Pt is alert. Not confused , motor grossly intact Skin: Skin is warm. No rash Psychiatric: Pt behavior is normal. No agitation.     Assessment & Plan:

## 2013-10-23 NOTE — Patient Instructions (Signed)
Please strain your urine for any possible passing of stones  Please take all new medication as prescribed  - the pain medication only if needed  You will be contacted regarding the referral for: [facility]  Please go to the LAB in the Basement (turn left off the elevator) for the tests to be done today  Please call immediately for any fever  Please go to ER for pain that will not go away, or not controlled with the medication

## 2013-10-23 NOTE — Assessment & Plan Note (Signed)
No current pain (stone passed?) , exam benign, pain does not seem MSK , GI related, Suspect renal stone vs other, for ct urogram, pain med for prn use, strain urine, check UA and bun/cr, cbc as ordered, consider referral urology for abnormal CT

## 2013-10-24 ENCOUNTER — Telehealth: Payer: Self-pay | Admitting: Internal Medicine

## 2013-10-24 ENCOUNTER — Ambulatory Visit (INDEPENDENT_AMBULATORY_CARE_PROVIDER_SITE_OTHER)
Admission: RE | Admit: 2013-10-24 | Discharge: 2013-10-24 | Disposition: A | Discharge status: Home / Self Care | Payer: BC Managed Care – PPO | Source: Ambulatory Visit | Attending: Internal Medicine | Admitting: Internal Medicine

## 2013-10-24 DIAGNOSIS — R109 Unspecified abdominal pain: Secondary | ICD-10-CM

## 2013-10-24 NOTE — Telephone Encounter (Signed)
emmi emailed °

## 2013-10-31 ENCOUNTER — Other Ambulatory Visit: Payer: Self-pay

## 2013-11-27 ENCOUNTER — Ambulatory Visit (INDEPENDENT_AMBULATORY_CARE_PROVIDER_SITE_OTHER): Payer: BC Managed Care – PPO | Admitting: Family Medicine

## 2013-11-27 ENCOUNTER — Ambulatory Visit: Payer: BC Managed Care – PPO | Admitting: Internal Medicine

## 2013-11-27 ENCOUNTER — Encounter: Payer: Self-pay | Admitting: Family Medicine

## 2013-11-27 VITALS — BP 136/88 | HR 81 | Ht 69.0 in | Wt 189.0 lb

## 2013-11-27 DIAGNOSIS — M7551 Bursitis of right shoulder: Secondary | ICD-10-CM

## 2013-11-27 NOTE — Progress Notes (Signed)
  [facility] Vaudie Engebret[facility] D.O. [facility] [address] [place], [place] [address] Phone: [phone] Subjective:     CC: Right shoulder pain follow-up  BJY:NWGNFAOZHYHPI:Subjective Rico JunkerCraig Mitcheltree is a 58 y.o. male coming in with complaint of right shoulder pain. Patient was seen previously was diagnosed with subacromial bursitis. Patient was given a corticosteroid injection and did tolerate the procedure very well. Patient states for the first 2 weeks he was completely pain free. Patient stopped doing the exercises on a regular basis and since then he has had some mild dull aching pain return. Patient states as long as he does exercises he knows he feels fine. He is resting comfortably at night. Denies any new symptoms.     Past medical history, social, surgical and family history all reviewed in electronic medical record.   Review of Systems: No headache, visual changes, nausea, vomiting, diarrhea, constipation, dizziness, abdominal pain, skin rash, fevers, chills, night sweats, weight loss, swollen lymph nodes, body aches, joint swelling, muscle aches, chest pain, shortness of breath, mood changes.   Objective Blood pressure 136/88, pulse 81, height 5\' 9"  (1.753 m), weight 189 lb (85.73 kg), SpO2 98 %.  General: No apparent distress alert and oriented x3 mood and affect normal, dressed appropriately.  HEENT: Pupils equal, extraocular movements intact  Respiratory: Patient's speak in full sentences and does not appear short of breath  Cardiovascular: No lower extremity edema, non tender, no erythema  Skin: Warm dry intact with no signs of infection or rash on extremities or on axial skeleton.  Abdomen: Soft nontender  Neuro: Cranial nerves II through XII are intact, neurovascularly intact in all extremities with 2+ DTRs and 2+ pulses.  Lymph: No lymphadenopathy of posterior or anterior cervical chain or axillae bilaterally.  Gait normal with good balance and coordination.  MSK:  Non tender with full  range of motion and good stability and symmetric strength and tone of  elbows, wrist, hip, knee and ankles bilaterally.  Shoulder: Right Inspection reveals no abnormalities, atrophy or asymmetry. Palpation is normal with no tenderness over AC joint or bicipital groove. ROM is full in all planes passively. Rotator cuff strength normal throughout. Mild signs of impingement still remaining but considerably better. Speeds and Yergason's tests normal. No labral pathology noted with negative Obrien's, negative clunk and good stability. Normal scapular function observed. No painful arc and no drop arm sign. No apprehension sign Contralateral shoulder unremarkable    Impression and Recommendations:     This case required medical decision making of moderate complexity.

## 2013-11-27 NOTE — Patient Instructions (Signed)
Good to see you Making my job too easy Exercises are key!!!! Ice at the end of the day for 20 minutes.  Try the pennsaid 2 times daily See me again when you need me.

## 2013-11-27 NOTE — Assessment & Plan Note (Signed)
Patient is doing significantly better at this time. Patient does not need any significant manipulation today. Discussed with patient at this point he continues to do well he can follow-up with me on an more as-needed basis. Encourage him to do the exercises regularly and patient declined formal physical therapy.

## 2013-12-18 ENCOUNTER — Ambulatory Visit: Payer: BC Managed Care – PPO | Admitting: Internal Medicine

## 2014-01-01 ENCOUNTER — Telehealth: Payer: Self-pay | Admitting: Internal Medicine

## 2014-01-01 MED ORDER — SILDENAFIL CITRATE 100 MG PO TABS: 50.0000 mg | ORAL_TABLET | Freq: Every day | ORAL | Status: DC | PRN

## 2014-01-01 NOTE — Telephone Encounter (Signed)
Patient informed. 

## 2014-01-01 NOTE — Telephone Encounter (Signed)
Pt requesting Viagra, [facility] / [address].  Pls let pt know

## 2014-01-01 NOTE — Telephone Encounter (Signed)
Done erx 

## 2014-02-06 ENCOUNTER — Ambulatory Visit: Payer: Self-pay | Admitting: Family Medicine

## 2014-02-13 ENCOUNTER — Ambulatory Visit: Payer: Self-pay | Admitting: Family Medicine

## 2014-02-16 ENCOUNTER — Other Ambulatory Visit (INDEPENDENT_AMBULATORY_CARE_PROVIDER_SITE_OTHER): Payer: BLUE CROSS/BLUE SHIELD

## 2014-02-16 ENCOUNTER — Ambulatory Visit (INDEPENDENT_AMBULATORY_CARE_PROVIDER_SITE_OTHER): Payer: BLUE CROSS/BLUE SHIELD | Admitting: Family Medicine

## 2014-02-16 ENCOUNTER — Encounter: Payer: Self-pay | Admitting: Family Medicine

## 2014-02-16 VITALS — BP 110/72 | HR 84 | Ht 69.0 in | Wt 192.0 lb

## 2014-02-16 DIAGNOSIS — M7551 Bursitis of right shoulder: Secondary | ICD-10-CM

## 2014-02-16 DIAGNOSIS — M25511 Pain in right shoulder: Secondary | ICD-10-CM

## 2014-02-16 NOTE — Progress Notes (Signed)
Pre visit review using our clinic review tool, if applicable. No additional management support is needed unless otherwise documented below in the visit note. 

## 2014-02-16 NOTE — Assessment & Plan Note (Signed)
Patient did have more of a subacromial bursitis. Patient was given different treatment options and patient did elect to have another injection today. We discussed icing regimen, home exercises, and patient declined formal physical therapy. We discussed over-the-counter medications a can make some benefit as well. Differential includes cervical radiculopathy but only mild osteophytic changes seen previously. Patient is not having much radiation or numbness in the hand. Patient will come back and see me again in 3 weeks to make sure that this pain is completely resolved. With patient's type 2 diabetes adhesive capsulitis is also within the differential but secondary to fall range of motion and this is  Not likely.

## 2014-02-16 NOTE — Progress Notes (Signed)
Jonathan ScaleZach Maricel Heath D.O. Jonathan Heath [address] [place], [place] [address] Phone: [phone] Subjective:     CC: Right shoulder pain follow-up  BJY:NWGNFAOZHYHPI:Subjective Jonathan Heath is a 59 y.o. male coming in with complaint of right shoulder pain. Patient was seen previously was diagnosed with subacromial bursitis. Patient was given a corticosteroid injection in August and was doing significantly better since then. Patient states he was doing significantly better until the last 2 weeks. Patient was not doing the exercises for the last 6 weeks. Patient states since that anytime he tries to do some the exercises he is in too much pain to actually do them. Patient states the pain is starting to wake him up again at night and this is making things difficult. Denies any weakness. Sometimes can have numbness in the hands. Rates the severity as 4 out of 10. Denies any neck pain that is associated with it.    Past medical history, social, surgical and family history all reviewed in electronic medical record.   Review of Systems: No headache, visual changes, nausea, vomiting, diarrhea, constipation, dizziness, abdominal pain, skin rash, fevers, chills, night sweats, weight loss, swollen lymph nodes, body aches, joint swelling, muscle aches, chest pain, shortness of breath, mood changes.   Objective Blood pressure 110/72, pulse 84, height 5\' 9"  (1.753 m), weight 192 lb (87.091 kg), SpO2 97 %.  General: No apparent distress alert and oriented x3 mood and affect normal, dressed appropriately.  HEENT: Pupils equal, extraocular movements intact  Respiratory: Patient's speak in full sentences and does not appear short of breath  Cardiovascular: No lower extremity edema, non tender, no erythema  Skin: Warm dry intact with no signs of infection or rash on extremities or on axial skeleton.  Abdomen: Soft nontender  Neuro: Cranial nerves II through XII are intact, neurovascularly intact in all extremities  with 2+ DTRs and 2+ pulses.  Lymph: No lymphadenopathy of posterior or anterior cervical chain or axillae bilaterally.  Gait normal with good balance and coordination.  MSK:  Non tender with full range of motion and good stability and symmetric strength and tone of  elbows, wrist, hip, knee and ankles bilaterally.  Neck: Inspection unremarkable. No palpable stepoffs. Negative Spurling's maneuver. Full neck range of motion Grip strength and sensation normal in bilateral hands Strength good C4 to T1 distribution No sensory change to C4 to T1 Negative Hoffman sign bilaterally Reflexes normal Shoulder: Right Inspection reveals no abnormalities, atrophy or asymmetry. Palpation is normal with no tenderness over AC joint or bicipital groove. ROM is full in all planes passively. Rotator cuff strength normal throughout. Severe impingement signs are noted today No labral pathology noted with negative Obrien's, negative clunk and good stability. Normal scapular function observed. No painful arc and no drop arm sign. No apprehension sign Contralateral shoulder unremarkable  Procedure: Real-time Ultrasound Guided Injection of right glenohumeral joint Device: GE Logiq E  Ultrasound guided injection is preferred based studies that show increased duration, increased effect, greater accuracy, decreased procedural pain, increased response rate with ultrasound guided versus blind injection.  Verbal informed consent obtained.  Time-out conducted.  Noted no overlying erythema, induration, or other signs of local infection.  Skin prepped in a sterile fashion.  Local anesthesia: Topical Ethyl chloride.  With sterile technique and under real time ultrasound guidance:  Joint visualized.  23g 1  inch needle inserted posterior approach. Pictures taken for needle placement. Patient did have injection of 2 cc of 1% lidocaine, 2 cc of 0.5%  Marcaine, and 1.0 cc of Kenalog 40 mg/dL. Completed without difficulty    Pain immediately resolved suggesting accurate placement of the medication.  Advised to call if fevers/chills, erythema, induration, drainage, or persistent bleeding.  Images permanently stored and available for review in the ultrasound unit.  Impression: Technically successful ultrasound guided injection.     Impression and Recommendations:     This case required medical decision making of moderate complexity.

## 2014-02-16 NOTE — Patient Instructions (Signed)
Good to see you Start the exercises again 3 times a week.  Ice in 6 hours for 2o minutes and do this each night before bed.  Continue the vitamins See me again in 3 weeks if not perfect.

## 2014-02-24 ENCOUNTER — Other Ambulatory Visit: Payer: Self-pay | Admitting: Internal Medicine

## 2014-07-13 ENCOUNTER — Other Ambulatory Visit: Payer: Self-pay

## 2014-08-13 ENCOUNTER — Other Ambulatory Visit: Payer: Self-pay | Admitting: Internal Medicine

## 2014-12-03 ENCOUNTER — Encounter: Payer: Self-pay | Admitting: Internal Medicine

## 2014-12-03 ENCOUNTER — Ambulatory Visit (INDEPENDENT_AMBULATORY_CARE_PROVIDER_SITE_OTHER): Payer: Self-pay | Admitting: Internal Medicine

## 2014-12-03 ENCOUNTER — Other Ambulatory Visit (INDEPENDENT_AMBULATORY_CARE_PROVIDER_SITE_OTHER): Payer: Self-pay

## 2014-12-03 ENCOUNTER — Other Ambulatory Visit: Payer: Self-pay | Admitting: Internal Medicine

## 2014-12-03 VITALS — BP 118/78 | HR 99 | Temp 98.1°F | Ht 69.0 in | Wt 189.0 lb

## 2014-12-03 DIAGNOSIS — Z0000 Encounter for general adult medical examination without abnormal findings: Secondary | ICD-10-CM

## 2014-12-03 DIAGNOSIS — E119 Type 2 diabetes mellitus without complications: Secondary | ICD-10-CM

## 2014-12-03 DIAGNOSIS — E785 Hyperlipidemia, unspecified: Secondary | ICD-10-CM

## 2014-12-03 DIAGNOSIS — Z0189 Encounter for other specified special examinations: Secondary | ICD-10-CM

## 2014-12-03 DIAGNOSIS — I10 Essential (primary) hypertension: Secondary | ICD-10-CM

## 2014-12-03 LAB — LIPID PANEL
CHOLESTEROL: 209 mg/dL — AB (ref 0–200)
HDL: 39.4 mg/dL (ref >39.00)
Total CHOL/HDL Ratio: 5
Triglycerides: 438 mg/dL — ABNORMAL HIGH (ref 0.0–149.0)

## 2014-12-03 LAB — BASIC METABOLIC PANEL
BUN: 11 mg/dL (ref 6–23)
CALCIUM: 9.5 mg/dL (ref 8.4–10.5)
CO2: 31 meq/L (ref 19–32)
Chloride: 102 mEq/L (ref 96–112)
Creatinine, Ser: 1.07 mg/dL (ref 0.40–1.50)
GFR: 91.59 mL/min (ref >60.00)
GLUCOSE: 151 mg/dL — AB (ref 70–99)
Potassium: 4.5 mEq/L (ref 3.5–5.1)
SODIUM: 138 meq/L (ref 135–145)

## 2014-12-03 LAB — LDL CHOLESTEROL, DIRECT: Direct LDL: 152 mg/dL

## 2014-12-03 LAB — PSA: PSA: 1.73 ng/mL (ref 0.10–4.00)

## 2014-12-03 LAB — HEMOGLOBIN A1C: Hgb A1c MFr Bld: 6.6 % — ABNORMAL HIGH (ref 4.6–6.5)

## 2014-12-03 MED ORDER — ATORVASTATIN CALCIUM 80 MG PO TABS: 80.0000 mg | ORAL_TABLET | Freq: Every day | ORAL | Status: DC

## 2014-12-03 NOTE — Progress Notes (Signed)
Subjective:    Patient ID: Jonathan Heath, male    DOB: 1955-12-29, 59 y.o.   MRN: [id number]  HPI  Here to f/u; overall doing ok,  Pt denies chest pain, increasing sob or doe, wheezing, orthopnea, PND, increased LE swelling, palpitations, dizziness or syncope.  Pt denies new neurological symptoms such as new headache, or facial or extremity weakness or numbness.  Pt denies polydipsia, polyuria, or low sugar episode.   Pt denies new neurological symptoms such as new headache, or facial or extremity weakness or numbness.   Pt states overall good compliance with meds, mostly trying to follow appropriate diet, with wt overall stable,  but little exercise however.  Working self employed, travels a lot, [employer] diet always, Plans to get flu shot later this yr.  No insurance today, asks for "bare bones" labs. Wt Readings from Last 3 Encounters:  12/03/14 189 lb (85.73 kg)  02/16/14 192 lb (87.091 kg)  11/27/13 189 lb (85.73 kg)   Past Medical History  Diagnosis Date  . HYPERLIPIDEMIA   . ERECTILE DYSFUNCTION   . ADHD   . DISC DISEASE, LUMBAR   . FRACTURE, RIGHT LEG   . DM2 (diabetes mellitus, type 2) (HCC)   . Hypertension    Past Surgical History  Procedure Laterality Date  . Hx fx right leg s/p orif      reports that he has never smoked. He does not have any smokeless tobacco history on file. He reports that he drinks alcohol. His drug history is not on file. family history includes Benign prostatic hyperplasia in his father; Heart disease in his father; Hypertension in his other. Allergies  Allergen Reactions  . Levofloxacin Nausea Only  . Lisinopril Other (See Comments)    Dizzy, confused, nausea, palp's and sweats   Current Outpatient Prescriptions on File Prior to Visit  Medication Sig Dispense Refill  . aspirin 81 MG EC tablet Take 81 mg by mouth daily.      Marland Kitchen. atorvastatin (LIPITOR) 80 MG tablet TAKE 1 TABLET BY MOUTH DAILY 90 tablet 2  . metFORMIN (GLUCOPHAGE-XR) 500 MG  24 hr tablet TAKE 2 TABLETS BY MOUTH DAILY WITH BREAKFAST 180 tablet 0  . methylphenidate (RITALIN) 20 MG tablet Take 20 mg by mouth. 1 by mouth two times a day every morning and at noon     . ONE TOUCH ULTRA TEST test strip USE AS DIRECTED 300 each 3  . albuterol (PROVENTIL HFA;VENTOLIN HFA) 108 (90 BASE) MCG/ACT inhaler Inhale 2 puffs into the lungs every 6 (six) hours as needed.    . cetirizine (ZYRTEC) 10 MG tablet Take 1 tablet (10 mg total) by mouth daily. (Patient not taking: Reported on 12/03/2014) 30 tablet 3  . cyclobenzaprine (FLEXERIL) 5 MG tablet Take 1 tablet (5 mg total) by mouth 3 (three) times daily as needed for muscle spasms. (Patient not taking: Reported on 12/03/2014) 60 tablet 1  . divalproex (DEPAKOTE) 250 MG 24 hr tablet Take 250 mg by mouth daily.      . fluticasone (FLONASE) 50 MCG/ACT nasal spray Place 2 sprays into the nose daily. (Patient not taking: Reported on 12/03/2014) 16 g 6  . naproxen (EC NAPROSYN) 500 MG EC tablet Take 1 tablet (500 mg total) by mouth 2 (two) times daily. (Patient not taking: Reported on 12/03/2014) 180 tablet 3  . oxyCODONE (OXY IR/ROXICODONE) 5 MG immediate release tablet 1-2 tabs by mouth every 6 hrs as needed for pain (Patient not taking: Reported on  12/03/2014) 40 tablet 0  . sildenafil (VIAGRA) 100 MG tablet Take 0.5-1 tablets (50-100 mg total) by mouth daily as needed for erectile dysfunction. (Patient not taking: Reported on 12/03/2014) 8 tablet 11  . traMADol (ULTRAM) 50 MG tablet Take 1 tablet (50 mg total) by mouth every 6 (six) hours as needed for pain. (Patient not taking: Reported on 12/03/2014) 60 tablet 1  . triamcinolone (KENALOG) 0.1 % cream Apply topically 2 (two) times daily.       No current facility-administered medications on file prior to visit.    Review of Systems  Constitutional: Negative for unusual diaphoresis or night sweats HENT: Negative for ringing in ear or discharge Eyes: Negative for double vision or  worsening visual disturbance.  Respiratory: Negative for choking and stridor.   Gastrointestinal: Negative for vomiting or other signifcant bowel change Genitourinary: Negative for hematuria or change in urine volume.  Musculoskeletal: Negative for other MSK pain or swelling Skin: Negative for color change and worsening wound.  Neurological: Negative for tremors and numbness other than noted  Psychiatric/Behavioral: Negative for decreased concentration or agitation other than above       Objective:   Physical Exam BP 118/78 mmHg  Pulse 99  Temp(Src) 98.1 F (36.7 C) (Oral)  Ht  (1.753 m)  Wt 189 lb (85.73 kg)  BMI 27.90 kg/m2  SpO2 91% VS noted,  Constitutional: Pt appears in no significant distress HENT: Head: NCAT.  Right Ear: External ear normal.  Left Ear: External ear normal.  Eyes: . Pupils are equal, round, and reactive to light. Conjunctivae and EOM are normal Neck: Normal range of motion. Neck supple.  Cardiovascular: Normal rate and regular rhythm.   Pulmonary/Chest: Effort normal and breath sounds without rales or wheezing.  Abd:  Soft, NT, ND, + BS Neurological: Pt is alert. Not confused , motor grossly intact Skin: Skin is warm. No rash, no LE edema Psychiatric: Pt behavior is normal. No agitation.     Assessment & Plan:

## 2014-12-03 NOTE — Progress Notes (Signed)
Pre visit review using our clinic review tool, if applicable. No additional management support is needed unless otherwise documented below in the visit note. 

## 2014-12-03 NOTE — Patient Instructions (Signed)

## 2014-12-03 NOTE — Assessment & Plan Note (Signed)
stable overall by history and exam, recent data reviewed with pt, and pt to continue medical treatment as before,  to f/u any worsening symptoms or concerns BP Readings from Last 3 Encounters:  12/03/14 118/78  02/16/14 110/72  11/27/13 136/88

## 2014-12-03 NOTE — Assessment & Plan Note (Signed)
stable overall by history and exam, recent data reviewed with pt, and pt to continue medical treatment as before,  to f/u any worsening symptoms or concerns Lab Results  Component Value Date   LDLCALC 166* 05/22/2013

## 2014-12-03 NOTE — Assessment & Plan Note (Signed)
stable overall by history and exam, recent data reviewed with pt, and pt to continue medical treatment as before,  to f/u any worsening symptoms or concerns Lab Results  Component Value Date   HGBA1C 7.2* 05/22/2013

## 2015-01-04 ENCOUNTER — Other Ambulatory Visit: Payer: Self-pay | Admitting: Internal Medicine

## 2015-01-26 ENCOUNTER — Ambulatory Visit: Payer: Self-pay | Admitting: Internal Medicine

## 2015-04-29 ENCOUNTER — Ambulatory Visit: Payer: Self-pay | Admitting: Internal Medicine

## 2015-04-29 DIAGNOSIS — Z0289 Encounter for other administrative examinations: Secondary | ICD-10-CM

## 2015-06-03 ENCOUNTER — Ambulatory Visit: Payer: Self-pay | Admitting: Internal Medicine

## 2015-07-27 ENCOUNTER — Other Ambulatory Visit: Payer: Self-pay | Admitting: Internal Medicine

## 2015-07-28 ENCOUNTER — Encounter: Payer: Self-pay | Admitting: Internal Medicine

## 2015-07-28 ENCOUNTER — Ambulatory Visit (INDEPENDENT_AMBULATORY_CARE_PROVIDER_SITE_OTHER): Payer: Self-pay | Admitting: Internal Medicine

## 2015-07-28 VITALS — BP 124/70 | HR 82 | Temp 98.0°F | Resp 20 | Wt 188.0 lb

## 2015-07-28 DIAGNOSIS — I10 Essential (primary) hypertension: Secondary | ICD-10-CM

## 2015-07-28 DIAGNOSIS — Z0001 Encounter for general adult medical examination with abnormal findings: Secondary | ICD-10-CM

## 2015-07-28 DIAGNOSIS — R6889 Other general symptoms and signs: Secondary | ICD-10-CM

## 2015-07-28 DIAGNOSIS — E785 Hyperlipidemia, unspecified: Secondary | ICD-10-CM

## 2015-07-28 DIAGNOSIS — R350 Frequency of micturition: Secondary | ICD-10-CM | POA: Insufficient documentation

## 2015-07-28 DIAGNOSIS — R300 Dysuria: Secondary | ICD-10-CM

## 2015-07-28 DIAGNOSIS — E119 Type 2 diabetes mellitus without complications: Secondary | ICD-10-CM

## 2015-07-28 LAB — POCT URINALYSIS DIPSTICK
Bilirubin, UA: NEGATIVE
Glucose, UA: NEGATIVE
Ketones, UA: NEGATIVE
NITRITE UA: NEGATIVE
PH UA: 6.5
PROTEIN UA: NEGATIVE
RBC UA: NEGATIVE
SPEC GRAV UA: 1.02
UROBILINOGEN UA: NEGATIVE

## 2015-07-28 MED ORDER — DOXYCYCLINE HYCLATE 100 MG PO TABS: 100.0000 mg | ORAL_TABLET | Freq: Two times a day (BID) | ORAL | Status: DC

## 2015-07-28 NOTE — Assessment & Plan Note (Signed)
stable overall by history and exam, recent data reviewed with pt, and pt to continue medical treatment as before,  to f/u any worsening symptoms or concerns Lab Results  Component Value Date   HGBA1C 6.6* 12/03/2014

## 2015-07-28 NOTE — Patient Instructions (Signed)
Please take all new medication as prescribed - the antibiotic  Please continue all other medications as before, and refills have been done if requested.  Please have the pharmacy call with any other refills you may need.  Please keep your appointments with your specialists as you may have planned  Please make appt for 4 wks for your Physical

## 2015-07-28 NOTE — Progress Notes (Signed)
Subjective:    Patient ID: Jonathan Heath, male    DOB: 14-Jan-1956, 60 y.o.   MRN: [id number]  HPI  Here with 3-4 days sudden onset urinary freq very unusual, culminating in difficulty driving back from [place] with wife such that had to stop care with urinary freq every 2 hrs. Denies urinary symptoms such as dysuria, flank pain, hematuria or n/v, fever, chills, though has had some urgency episodes as well, but no wetting accidents.    Pt denies polydipsia, polyuria, or low sugar symptoms such as weakness or confusion improved with po intake.  Pt states overall good compliance with meds, trying to follow lower cholesterol, diabetic diet, though admits did not follow on recent weekend trip - was cbg 358 at 2 days ago after eating nice restraunts, then 175 yesterday back at home away from restrauant, forgot to take metformin with him as well.  No prior hx of UTI or prostatitis.   Past Medical History  Diagnosis Date  . HYPERLIPIDEMIA   . ERECTILE DYSFUNCTION   . ADHD   . DISC DISEASE, LUMBAR   . FRACTURE, RIGHT LEG   . DM2 (diabetes mellitus, type 2) (HCC)   . Hypertension    Past Surgical History  Procedure Laterality Date  . Hx fx right leg s/p orif      reports that he has never smoked. He does not have any smokeless tobacco history on file. He reports that he drinks alcohol. His drug history is not on file. family history includes Benign prostatic hyperplasia in his father; Heart disease in his father; Hypertension in his other. Allergies  Allergen Reactions  . Levofloxacin Nausea Only  . Lisinopril Other (See Comments)    Dizzy, confused, nausea, palp's and sweats   Current Outpatient Prescriptions on File Prior to Visit  Medication Sig Dispense Refill  . albuterol (PROVENTIL HFA;VENTOLIN HFA) 108 (90 BASE) MCG/ACT inhaler Inhale 2 puffs into the lungs every 6 (six) hours as needed.    Marland Kitchen aspirin 81 MG EC tablet Take 81 mg by mouth daily.      Marland Kitchen atorvastatin (LIPITOR) 80 MG  tablet Take 1 tablet (80 mg total) by mouth daily. 90 tablet 3  . cetirizine (ZYRTEC) 10 MG tablet Take 1 tablet (10 mg total) by mouth daily. 30 tablet 3  . cyclobenzaprine (FLEXERIL) 5 MG tablet Take 1 tablet (5 mg total) by mouth 3 (three) times daily as needed for muscle spasms. 60 tablet 1  . divalproex (DEPAKOTE) 250 MG 24 hr tablet Take 250 mg by mouth daily.      . fluticasone (FLONASE) 50 MCG/ACT nasal spray Place 2 sprays into the nose daily. 16 g 6  . metFORMIN (GLUCOPHAGE-XR) 500 MG 24 hr tablet TAKE 2 TABLETS BY MOUTH DAILY WITH BREAKFAST 180 tablet 0  . methylphenidate (RITALIN) 20 MG tablet Take 20 mg by mouth. 1 by mouth two times a day every morning and at noon     . naproxen (EC NAPROSYN) 500 MG EC tablet Take 1 tablet (500 mg total) by mouth 2 (two) times daily. 180 tablet 3  . ONE TOUCH ULTRA TEST test strip USE AS DIRECTED 300 each 3  . oxyCODONE (OXY IR/ROXICODONE) 5 MG immediate release tablet 1-2 tabs by mouth every 6 hrs as needed for pain 40 tablet 0  . sildenafil (VIAGRA) 100 MG tablet Take 0.5-1 tablets (50-100 mg total) by mouth daily as needed for erectile dysfunction. 8 tablet 11  . traMADol (ULTRAM) 50 MG tablet  Take 1 tablet (50 mg total) by mouth every 6 (six) hours as needed for pain. 60 tablet 1  . triamcinolone (KENALOG) 0.1 % cream Apply topically 2 (two) times daily.       No current facility-administered medications on file prior to visit.   Review of Systems  Constitutional: Negative for unusual diaphoresis or night sweats HENT: Negative for ear swelling or discharge Eyes: Negative for worsening visual haziness  Respiratory: Negative for choking and stridor.   Gastrointestinal: Negative for distension or worsening eructation Genitourinary: Negative for retention or change in urine volume.  Musculoskeletal: Negative for other MSK pain or swelling Skin: Negative for color change and worsening wound Neurological: Negative for tremors and numbness other  than noted  Psychiatric/Behavioral: Negative for decreased concentration or agitation other than above       Objective:   Physical Exam BP 124/70 mmHg  Pulse 82  Temp(Src) 98 F (36.7 C) (Oral)  Resp 20  Wt 188 lb (85.276 kg)  SpO2 99% VS noted, non toxic Constitutional: Pt appears in no apparent distress HENT: Head: NCAT.  Right Ear: External ear normal.  Left Ear: External ear normal.  Eyes: . Pupils are equal, round, and reactive to light. Conjunctivae and EOM are normal Neck: Normal range of motion. Neck supple.  Cardiovascular: Normal rate and regular rhythm.   Pulmonary/Chest: Effort normal and breath sounds without rales or wheezing.  Abd:  Soft, NT, ND, + BS DRE: declines Neurological: Pt is alert. Not confused , motor grossly intact Skin: Skin is warm. No rash, no LE edema Psychiatric: Pt behavior is normal. No agitation.   POCT Urinalysis Dipstick  Status: Finalresult Visible to patient:  Not Released Dx:  Dysuria              Ref Range 5:36 PM (07/28/15) 626yr ago (05/22/13) 5426yr ago (10/18/12) 7364yr ago (05/22/11)    Color, UA  yellow       Clarity, UA  cloudy       Glucose, UA  negative       Bilirubin, UA  negative       Ketones, UA  negative       Spec Grav, UA  1.020       Blood, UA  negative       pH, UA  6.5       Protein, UA  negative       Urobilinogen, UA  negative 1.0 1.0 0.2    Nitrite, UA  negative       Leukocytes, UA Negative  small (1+) (A)               Assessment & Plan:

## 2015-07-28 NOTE — Assessment & Plan Note (Signed)
stable overall by history and exam, recent data reviewed with pt, and pt to continue medical treatment as before,  to f/u any worsening symptoms or concerns BP Readings from Last 3 Encounters:  07/28/15 124/70  12/03/14 118/78  02/16/14 110/72

## 2015-07-28 NOTE — Progress Notes (Signed)
Pre visit review using our clinic review tool, if applicable. No additional management support is needed unless otherwise documented below in the visit note. 

## 2015-07-28 NOTE — Assessment & Plan Note (Signed)
Pt with first episode likely acute prostatitis, for antibx course x 3 wks,  to f/u any worsening symptoms or concerns

## 2015-07-28 NOTE — Assessment & Plan Note (Signed)
stable overall by history and exam, recent data reviewed with pt, now tolerating high dose statin, and pt to continue medical treatment as before,  to f/u any worsening symptoms or concerns, f/u lab next visit, declines at this time Lab Results  Component Value Date   LDLCALC 166* 05/22/2013

## 2015-09-16 ENCOUNTER — Encounter: Payer: Self-pay | Admitting: Internal Medicine

## 2015-09-16 ENCOUNTER — Other Ambulatory Visit (INDEPENDENT_AMBULATORY_CARE_PROVIDER_SITE_OTHER): Payer: Self-pay

## 2015-09-16 ENCOUNTER — Ambulatory Visit (INDEPENDENT_AMBULATORY_CARE_PROVIDER_SITE_OTHER): Payer: Self-pay | Admitting: Internal Medicine

## 2015-09-16 VITALS — BP 124/88 | HR 95 | Temp 98.1°F | Resp 16 | Wt 186.0 lb

## 2015-09-16 DIAGNOSIS — Z0001 Encounter for general adult medical examination with abnormal findings: Secondary | ICD-10-CM

## 2015-09-16 DIAGNOSIS — I10 Essential (primary) hypertension: Secondary | ICD-10-CM

## 2015-09-16 DIAGNOSIS — Z1159 Encounter for screening for other viral diseases: Secondary | ICD-10-CM

## 2015-09-16 DIAGNOSIS — E119 Type 2 diabetes mellitus without complications: Secondary | ICD-10-CM

## 2015-09-16 DIAGNOSIS — R6889 Other general symptoms and signs: Secondary | ICD-10-CM

## 2015-09-16 DIAGNOSIS — E785 Hyperlipidemia, unspecified: Secondary | ICD-10-CM

## 2015-09-16 DIAGNOSIS — R7989 Other specified abnormal findings of blood chemistry: Secondary | ICD-10-CM

## 2015-09-16 DIAGNOSIS — Z0000 Encounter for general adult medical examination without abnormal findings: Secondary | ICD-10-CM

## 2015-09-16 LAB — HEPATIC FUNCTION PANEL
ALK PHOS: 96 U/L (ref 39–117)
ALT: 16 U/L (ref 0–53)
AST: 18 U/L (ref 0–37)
Albumin: 4.5 g/dL (ref 3.5–5.2)
BILIRUBIN DIRECT: 0.3 mg/dL (ref 0.0–0.3)
Total Bilirubin: 2 mg/dL — ABNORMAL HIGH (ref 0.2–1.2)
Total Protein: 7.3 g/dL (ref 6.0–8.3)

## 2015-09-16 LAB — BASIC METABOLIC PANEL
BUN: 11 mg/dL (ref 6–23)
CALCIUM: 9.7 mg/dL (ref 8.4–10.5)
CO2: 32 mEq/L (ref 19–32)
Chloride: 99 mEq/L (ref 96–112)
Creatinine, Ser: 1.15 mg/dL (ref 0.40–1.50)
GFR: 84.05 mL/min (ref >60.00)
GLUCOSE: 270 mg/dL — AB (ref 70–99)
POTASSIUM: 4.7 meq/L (ref 3.5–5.1)
SODIUM: 136 meq/L (ref 135–145)

## 2015-09-16 LAB — URINALYSIS, ROUTINE W REFLEX MICROSCOPIC
BILIRUBIN URINE: NEGATIVE
Hgb urine dipstick: NEGATIVE
Ketones, ur: NEGATIVE
LEUKOCYTES UA: NEGATIVE
Nitrite: NEGATIVE
PH: 5.5 (ref 5.0–8.0)
RBC / HPF: NONE SEEN (ref 0–?)
SPECIFIC GRAVITY, URINE: 1.015 (ref 1.000–1.030)
TOTAL PROTEIN, URINE-UPE24: NEGATIVE
Urine Glucose: >=1000 — AB
Urobilinogen, UA: 1 (ref 0.0–1.0)

## 2015-09-16 LAB — CBC WITH DIFFERENTIAL/PLATELET
BASOS PCT: 0.4 % (ref 0.0–3.0)
Basophils Absolute: 0 10*3/uL (ref 0.0–0.1)
EOS PCT: 2.9 % (ref 0.0–5.0)
Eosinophils Absolute: 0.3 10*3/uL (ref 0.0–0.7)
HCT: 41.8 % (ref 39.0–52.0)
Hemoglobin: 14.1 g/dL (ref 13.0–17.0)
LYMPHS ABS: 1.3 10*3/uL (ref 0.7–4.0)
Lymphocytes Relative: 14.4 % (ref 12.0–46.0)
MCHC: 33.8 g/dL (ref 30.0–36.0)
MCV: 86.4 fl (ref 78.0–100.0)
MONO ABS: 0.3 10*3/uL (ref 0.1–1.0)
MONOS PCT: 4 % (ref 3.0–12.0)
NEUTROS ABS: 6.9 10*3/uL (ref 1.4–7.7)
NEUTROS PCT: 78.3 % — AB (ref 43.0–77.0)
PLATELETS: 166 10*3/uL (ref 150.0–400.0)
RBC: 4.83 Mil/uL (ref 4.22–5.81)
RDW: 13.2 % (ref 11.5–15.5)
WBC: 8.8 10*3/uL (ref 4.0–10.5)

## 2015-09-16 LAB — LIPID PANEL
Cholesterol: 187 mg/dL (ref 0–200)
HDL: 43.1 mg/dL (ref >39.00)
NONHDL: 144.36
Total CHOL/HDL Ratio: 4
Triglycerides: 290 mg/dL — ABNORMAL HIGH (ref 0.0–149.0)
VLDL: 58 mg/dL — AB (ref 0.0–40.0)

## 2015-09-16 LAB — LDL CHOLESTEROL, DIRECT: LDL DIRECT: 118 mg/dL

## 2015-09-16 LAB — TSH: TSH: 1.05 u[IU]/mL (ref 0.35–4.50)

## 2015-09-16 LAB — MICROALBUMIN / CREATININE URINE RATIO
Creatinine,U: 128.6 mg/dL
MICROALB/CREAT RATIO: 0.5 mg/g (ref 0.0–30.0)

## 2015-09-16 LAB — PSA: PSA: 1.11 ng/mL (ref 0.10–4.00)

## 2015-09-16 LAB — HEMOGLOBIN A1C: HEMOGLOBIN A1C: 7.5 % — AB (ref 4.6–6.5)

## 2015-09-16 MED ORDER — SILDENAFIL CITRATE 100 MG PO TABS: 50.0000 mg | ORAL_TABLET | Freq: Every day | ORAL | 11 refills | Status: DC | PRN

## 2015-09-16 NOTE — Progress Notes (Signed)
Pre visit review using our clinic review tool, if applicable. No additional management support is needed unless otherwise documented below in the visit note. 

## 2015-09-16 NOTE — Patient Instructions (Addendum)
Please continue all other medications as before, and refills have been done if requested., including the lipitor 80 mg increased  Please have the pharmacy call with any other refills you may need.  Please continue your efforts at being more active, low cholesterol diet, and weight control.  You are otherwise up to date with prevention measures today.  Please keep your appointments with your specialists as you may have planned  Please go to the LAB in the Basement (turn left off the elevator) for the tests to be done today  You will be contacted by phone if any changes need to be made immediately.  Otherwise, you will receive a letter about your results with an explanation, but please check with MyChart first.  Please remember to sign up for MyChart if you have not done so, as this will be important to you in the future with finding out test results, communicating by private email, and scheduling acute appointments online when needed.  Please return in 6 months, or sooner if needed, with Lab testing done 3-5 days before

## 2015-09-16 NOTE — Progress Notes (Signed)
Subjective:    Patient ID: Jonathan Heath, male    DOB: May 23, 1955, 60 y.o.   MRN: [id number]  HPI   Here for wellness and f/u;  Overall doing ok;  Pt denies Chest pain, worsening SOB, DOE, wheezing, orthopnea, PND, worsening LE edema, palpitations, dizziness or syncope.  Pt denies neurological change such as new headache, facial or extremity weakness.  Pt denies polydipsia, polyuria, or low sugar symptoms. Pt states overall good compliance with treatment and medications, good tolerability, and has been trying to follow appropriate diet.  Pt denies worsening depressive symptoms, suicidal ideation or panic. No fever, night sweats, wt loss, loss of appetite, or other constitutional symptoms.  Pt states good ability with ADL's, has low fall risk, home safety reviewed and adequate, no other significant changes in hearing or vision, and only occasionally active with exercise. 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   Going to gym twice per wk Wt Readings from Last 3 Encounters:  09/16/15 186 lb (84.4 kg)  07/28/15 188 lb (85.3 kg)  12/03/14 189 lb (85.7 kg)  Declines flu shot.   Past Medical History:  Diagnosis Date  . ADHD   . DISC DISEASE, LUMBAR   . DM2 (diabetes mellitus, type 2) (HCC)   . ERECTILE DYSFUNCTION   . FRACTURE, RIGHT LEG   . HYPERLIPIDEMIA   . Hypertension    Past Surgical History:  Procedure Laterality Date  . hx fx right leg s/p ORIF      reports that he has never smoked. He does not have any smokeless tobacco history on file. He reports that he drinks alcohol. His drug history is not on file. family history includes Benign prostatic hyperplasia in his father; Heart disease in his father; Hypertension in his other. Allergies  Allergen Reactions  . Levofloxacin Nausea Only  . Lisinopril Other (See Comments)    Dizzy, confused, nausea, palp's and sweats  . Doxycycline Palpitations    Increased heart rate, dizziness, nausea, vomiting.     Current Outpatient Prescriptions on File Prior to Visit  Medication Sig Dispense Refill  . albuterol (PROVENTIL HFA;VENTOLIN HFA) 108 (90 BASE) MCG/ACT inhaler Inhale 2 puffs into the lungs every 6 (six) hours as needed.    Marland Kitchen. aspirin 81 MG EC tablet Take 81 mg by mouth daily.      Marland Kitchen. atorvastatin (LIPITOR) 80 MG tablet Take 1 tablet (80 mg total) by mouth daily. 90 tablet 3  . cetirizine (ZYRTEC) 10 MG tablet Take 1 tablet (10 mg total) by mouth daily. 30 tablet 3  . cyclobenzaprine (FLEXERIL) 5 MG tablet Take 1 tablet (5 mg total) by mouth 3 (three) times daily as needed for muscle spasms. 60 tablet 1  . divalproex (DEPAKOTE) 250 MG 24 hr tablet Take 250 mg by mouth daily.      . fluticasone (FLONASE) 50 MCG/ACT nasal spray Place 2 sprays into the nose daily. 16 g 6  . metFORMIN (GLUCOPHAGE-XR) 500 MG 24 hr tablet TAKE 2 TABLETS BY MOUTH DAILY WITH BREAKFAST 180 tablet 0  . methylphenidate (RITALIN) 20 MG tablet Take 20 mg by mouth. 1 by mouth two times a day every morning and at noon     . naproxen (EC NAPROSYN) 500 MG EC tablet Take 1 tablet (500 mg total) by mouth 2 (two) times daily. 180 tablet 3  . ONE TOUCH ULTRA TEST test strip USE AS DIRECTED 300 each 3  . oxyCODONE (OXY IR/ROXICODONE) 5 MG immediate release  tablet 1-2 tabs by mouth every 6 hrs as needed for pain 40 tablet 0  . traMADol (ULTRAM) 50 MG tablet Take 1 tablet (50 mg total) by mouth every 6 (six) hours as needed for pain. 60 tablet 1  . triamcinolone (KENALOG) 0.1 % cream Apply topically 2 (two) times daily.       No current facility-administered medications on file prior to visit.     Review of Systems Constitutional: Negative for increased diaphoresis, or other activity, appetite or siginficant weight change other than noted HENT: Negative for worsening hearing loss, ear pain, facial swelling, mouth sores and neck stiffness.   Eyes: Negative for other worsening pain, redness or visual disturbance.  Respiratory:  Negative for choking or stridor Cardiovascular: Negative for other chest pain and palpitations.  Gastrointestinal: Negative for worsening diarrhea, blood in stool, or abdominal distention Genitourinary: Negative for hematuria, flank pain or change in urine volume.  Musculoskeletal: Negative for myalgias or other joint complaints.  Skin: Negative for other color change and wound or drainage.  Neurological: Negative for syncope and numbness. other than noted Hematological: Negative for adenopathy. or other swelling Psychiatric/Behavioral: Negative for hallucinations, SI, self-injury, decreased concentration or other worsening agitation.      Objective:   Physical Exam BP 124/88   Pulse 95   Temp 98.1 F (36.7 C) (Oral)   Resp 16   Wt 186 lb (84.4 kg)   SpO2 98%   BMI 27.47 kg/m  VS noted,  Constitutional: Pt is oriented to person, place, and time. Appears well-developed and well-nourished, in no significant distress Head: Normocephalic and atraumatic  Eyes: Conjunctivae and EOM are normal. Pupils are equal, round, and reactive to light Right Ear: External ear normal.  Left Ear: External ear normal Nose: Nose normal.  Mouth/Throat: Oropharynx is clear and moist  Neck: Normal range of motion. Neck supple. No JVD present. No tracheal deviation present or significant neck LA or mass Cardiovascular: Normal rate, regular rhythm, normal heart sounds and intact distal pulses.   Pulmonary/Chest: Effort normal and breath sounds without rales or wheezing  Abdominal: Soft. Bowel sounds are normal. NT. No HSM  Musculoskeletal: Normal range of motion. Exhibits no edema Lymphadenopathy: Has no cervical adenopathy.  Neurological: Pt is alert and oriented to person, place, and time. Pt has normal reflexes. No cranial nerve deficit. Motor grossly intact Skin: Skin is warm and dry. No rash noted or new ulcers Psychiatric:  Has normal mood and affect. Behavior is normal.     Assessment & Plan:

## 2015-09-17 ENCOUNTER — Encounter: Payer: Self-pay | Admitting: Internal Medicine

## 2015-09-17 ENCOUNTER — Other Ambulatory Visit: Payer: Self-pay | Admitting: Internal Medicine

## 2015-09-17 LAB — HEPATITIS C ANTIBODY: HCV AB: NEGATIVE

## 2015-09-17 MED ORDER — METFORMIN HCL ER 500 MG PO TB24: ORAL_TABLET | ORAL | 3 refills | Status: DC

## 2015-09-19 NOTE — Assessment & Plan Note (Signed)
stable overall by history and exam, recent data reviewed with pt, and pt to continue medical treatment as before,  to f/u any worsening symptoms or concerns BP Readings from Last 3 Encounters:  09/16/15 124/88  07/28/15 124/70  12/03/14 118/78

## 2015-09-19 NOTE — Assessment & Plan Note (Signed)
stable overall by history and exam, recent data reviewed with pt, and pt to continue medical treatment as before,  to f/u any worsening symptoms or concerns Lab Results  Component Value Date   LDLCALC 166 (H) 05/22/2013   For increased lipitor to 80 mg,   In addition to the time spent performing CPE, I spent an additional 25 minutes face to face,in which greater than 50% of this time was spent in counseling and coordination of care for patient's acute illness as documented.

## 2015-09-19 NOTE — Assessment & Plan Note (Addendum)
D/w pt need for stricter adherence to diet and exercise, wt control, cont same tx  to f/u any worsening symptoms or concerns

## 2015-09-19 NOTE — Assessment & Plan Note (Signed)

## 2015-11-10 ENCOUNTER — Other Ambulatory Visit: Payer: Self-pay | Admitting: Internal Medicine

## 2015-12-07 ENCOUNTER — Other Ambulatory Visit: Payer: Self-pay | Admitting: Internal Medicine

## 2015-12-16 ENCOUNTER — Encounter: Payer: Self-pay | Admitting: Internal Medicine

## 2016-03-03 ENCOUNTER — Ambulatory Visit (INDEPENDENT_AMBULATORY_CARE_PROVIDER_SITE_OTHER): Payer: Self-pay | Admitting: Family

## 2016-03-03 ENCOUNTER — Other Ambulatory Visit: Payer: Self-pay

## 2016-03-03 VITALS — BP 118/84 | HR 82 | Temp 98.1°F | Resp 16 | Ht 69.0 in | Wt 187.0 lb

## 2016-03-03 DIAGNOSIS — R361 Hematospermia: Secondary | ICD-10-CM | POA: Insufficient documentation

## 2016-03-03 LAB — POCT URINALYSIS DIPSTICK
BILIRUBIN UA: NEGATIVE
Glucose, UA: NEGATIVE
KETONES UA: NEGATIVE
Leukocytes, UA: NEGATIVE
NITRITE UA: NEGATIVE
PH UA: 6
Protein, UA: NEGATIVE
RBC UA: NEGATIVE
Spec Grav, UA: >=1.03
Urobilinogen, UA: NEGATIVE

## 2016-03-03 MED ORDER — SULFAMETHOXAZOLE-TRIMETHOPRIM 400-80 MG PO TABS: 1.0000 | ORAL_TABLET | Freq: Two times a day (BID) | ORAL | 0 refills | Status: DC

## 2016-03-03 NOTE — Assessment & Plan Note (Signed)
New onset blood in semen which has occurred one time. No other urinary symptoms with negative UA. Urine will be sent for culture. There is a change of underlying prostatitis. Prescription for Bactrim provided if he notes symptoms again. Advised if symptoms continue x 1 month a referral to urology will be placed.

## 2016-03-03 NOTE — Progress Notes (Signed)
Subjective:    Patient ID: Jonathan Heath, male    DOB: September 04, 1955, 61 y.o.   MRN: [id number]  Chief Complaint  Patient presents with  . Other    Blood in semen    HPI:  Jonathan Heath is a 61 y.o. male who  has a past medical history of ADHD; DISC DISEASE, LUMBAR; DM2 (diabetes mellitus, type 2) (HCC); ERECTILE DYSFUNCTION; FRACTURE, RIGHT LEG; HYPERLIPIDEMIA; and Hypertension. and presents today for an office visit.  This is a new problem. Associated symptoms of redness located in his semen has been going on for about 4 days. Denies any current urinary symptoms including dysuria, urinary frequency, urgency or flank pains. No fevers, swelling, or masses noted. Notes that when he removed the condom there was a little bit of redness/pinkish/orange color. No trauma or injury. No recent biopsies. Did wear a pair of pants recently that were very tight in the groin area.  Allergies  Allergen Reactions  . Levofloxacin Nausea Only  . Lisinopril Other (See Comments)    Dizzy, confused, nausea, palp's and sweats  . Doxycycline Palpitations    Increased heart rate, dizziness, nausea, vomiting.       Outpatient Medications Prior to Visit  Medication Sig Dispense Refill  . albuterol (PROVENTIL HFA;VENTOLIN HFA) 108 (90 BASE) MCG/ACT inhaler Inhale 2 puffs into the lungs every 6 (six) hours as needed.    Marland Kitchen. aspirin 81 MG EC tablet Take 81 mg by mouth daily.      Marland Kitchen. atorvastatin (LIPITOR) 80 MG tablet TAKE 1 TABLET(80 MG) BY MOUTH DAILY 90 tablet 0  . cetirizine (ZYRTEC) 10 MG tablet Take 1 tablet (10 mg total) by mouth daily. 30 tablet 3  . cyclobenzaprine (FLEXERIL) 5 MG tablet Take 1 tablet (5 mg total) by mouth 3 (three) times daily as needed for muscle spasms. 60 tablet 1  . divalproex (DEPAKOTE) 250 MG 24 hr tablet Take 250 mg by mouth daily.      . fluticasone (FLONASE) 50 MCG/ACT nasal spray Place 2 sprays into the nose daily. 16 g 6  . metFORMIN (GLUCOPHAGE-XR) 500 MG 24 hr tablet  TAKE 2 TABLETS BY MOUTH DAILY WITH BREAKFAST 180 tablet 0  . methylphenidate (RITALIN) 20 MG tablet Take 20 mg by mouth. 1 by mouth two times a day every morning and at noon     . naproxen (EC NAPROSYN) 500 MG EC tablet Take 1 tablet (500 mg total) by mouth 2 (two) times daily. 180 tablet 3  . ONE TOUCH ULTRA TEST test strip USE AS DIRECTED 300 each 3  . oxyCODONE (OXY IR/ROXICODONE) 5 MG immediate release tablet 1-2 tabs by mouth every 6 hrs as needed for pain 40 tablet 0  . sildenafil (VIAGRA) 100 MG tablet Take 0.5-1 tablets (50-100 mg total) by mouth daily as needed for erectile dysfunction. 8 tablet 11  . traMADol (ULTRAM) 50 MG tablet Take 1 tablet (50 mg total) by mouth every 6 (six) hours as needed for pain. 60 tablet 1  . triamcinolone (KENALOG) 0.1 % cream Apply topically 2 (two) times daily.      . metFORMIN (GLUCOPHAGE-XR) 500 MG 24 hr tablet 3 tabs by mouth every AM 270 tablet 3   No facility-administered medications prior to visit.       Past Surgical History:  Procedure Laterality Date  . hx fx right leg s/p ORIF        Past Medical History:  Diagnosis Date  . ADHD   . DISC  DISEASE, LUMBAR   . DM2 (diabetes mellitus, type 2) (HCC)   . ERECTILE DYSFUNCTION   . FRACTURE, RIGHT LEG   . HYPERLIPIDEMIA   . Hypertension       Review of Systems  Constitutional: Negative for chills and fever.  Gastrointestinal: Negative for abdominal pain.  Genitourinary: Negative for decreased urine volume, difficulty urinating, discharge, dysuria, flank pain, frequency, hematuria, testicular pain and urgency.      Objective:    BP 118/84 (BP Location: Left Arm, Patient Position: Sitting, Cuff Size: Large)   Pulse 82   Temp 98.1 F (36.7 C) (Oral)   Resp 16   Ht 5\' 9"  (1.753 m)   Wt 187 lb (84.8 kg)   SpO2 98%   BMI 27.62 kg/m  Nursing note and vital signs reviewed.  Physical Exam  Constitutional: He is oriented to person, place, and time. He appears well-developed and  well-nourished. No distress.  Cardiovascular: Normal rate, regular rhythm, normal heart sounds and intact distal pulses.   Pulmonary/Chest: Effort normal and breath sounds normal.  Genitourinary: Testes normal and penis normal.  Neurological: He is alert and oriented to person, place, and time.  Skin: Skin is warm and dry.  Psychiatric: He has a normal mood and affect. His behavior is normal. Judgment and thought content normal.       Assessment & Plan:   Problem List Items Addressed This Visit      Other   Blood in semen - Primary    New onset blood in semen which has occurred one time. No other urinary symptoms with negative UA. Urine will be sent for culture. There is a change of underlying prostatitis. Prescription for Bactrim provided if he notes symptoms again. Advised if symptoms continue x 1 month a referral to urology will be placed.       Relevant Orders   POCT urinalysis dipstick (Completed)   Urine culture       I am having Mr. Stock start on sulfamethoxazole-trimethoprim. I am also having him maintain his divalproex, aspirin, methylphenidate, triamcinolone cream, ONE TOUCH ULTRA TEST, traMADol, cyclobenzaprine, albuterol, cetirizine, fluticasone, naproxen, oxyCODONE, sildenafil, metFORMIN, and atorvastatin.   Meds ordered this encounter  Medications  . sulfamethoxazole-trimethoprim (BACTRIM) 400-80 MG tablet    Sig: Take 1 tablet by mouth 2 (two) times daily.    Dispense:  28 tablet    Refill:  0    Order Specific Question:   Supervising Provider    Answer:   Hillard Danker A [[id number]]     Follow-up: Return if symptoms worsen or fail to improve.  Jeanine Luz, FNP

## 2016-03-03 NOTE — Patient Instructions (Addendum)
Thank you for choosing [facility].  SUMMARY AND INSTRUCTIONS:  We will email with the urine culture results.   If you continue to have symptoms may try antibiotic given.  If after a month symptoms remain, we will send a referral to urology.  Medication:  Your prescription(s) have been submitted to your pharmacy or been printed and provided for you. Please take as directed and contact our office if you believe you are having problem(s) with the medication(s) or have any questions.  Follow up:  If your symptoms worsen or fail to improve, please contact our office for further instruction, or in case of emergency go directly to the emergency room at the closest medical facility.

## 2016-03-07 ENCOUNTER — Encounter: Payer: Self-pay | Admitting: Family

## 2016-03-07 LAB — URINE CULTURE

## 2016-03-16 ENCOUNTER — Ambulatory Visit: Payer: Self-pay | Admitting: Internal Medicine

## 2016-04-14 ENCOUNTER — Other Ambulatory Visit: Payer: Self-pay | Admitting: Family

## 2016-04-30 ENCOUNTER — Other Ambulatory Visit: Payer: Self-pay | Admitting: Internal Medicine

## 2016-05-04 ENCOUNTER — Encounter: Payer: Self-pay | Admitting: Internal Medicine

## 2016-05-04 ENCOUNTER — Ambulatory Visit (INDEPENDENT_AMBULATORY_CARE_PROVIDER_SITE_OTHER): Payer: Self-pay | Admitting: Internal Medicine

## 2016-05-04 VITALS — BP 120/84 | HR 92 | Ht 69.0 in | Wt 187.0 lb

## 2016-05-04 DIAGNOSIS — E785 Hyperlipidemia, unspecified: Secondary | ICD-10-CM

## 2016-05-04 DIAGNOSIS — Z23 Encounter for immunization: Secondary | ICD-10-CM

## 2016-05-04 DIAGNOSIS — E119 Type 2 diabetes mellitus without complications: Secondary | ICD-10-CM

## 2016-05-04 DIAGNOSIS — I10 Essential (primary) hypertension: Secondary | ICD-10-CM

## 2016-05-04 DIAGNOSIS — Z0001 Encounter for general adult medical examination with abnormal findings: Secondary | ICD-10-CM

## 2016-05-04 NOTE — Patient Instructions (Signed)
You had the Prevnar pneumonia shot today  Please continue all other medications as before, and refills have been done if requested.  Please have the pharmacy call with any other refills you may need.  Please continue your efforts at being more active, low cholesterol diet, and weight control.  You are otherwise up to date with prevention measures today.  Please keep your appointments with your specialists as you may have planned  Please go to the LAB in the Basement (turn left off the elevator) for the tests to be done at your convenience  You will be contacted by phone if any changes need to be made immediately.  Otherwise, you will receive a letter about your results with an explanation, but please check with MyChart first.  Please return in 6 months, or sooner if needed, with Lab testing done 3-5 days before

## 2016-05-04 NOTE — Progress Notes (Signed)
Pre visit review using our clinic review tool, if applicable. No additional management support is needed unless otherwise documented below in the visit note. 

## 2016-05-04 NOTE — Progress Notes (Signed)
Subjective:    Patient ID: Jonathan Heath, male    DOB: Oct 13, 1955, 61 y.o.   MRN: [id number]  HPI  Here to f/u; overall doing ok,  Pt denies chest pain, increasing sob or doe, wheezing, orthopnea, PND, increased LE swelling, palpitations, dizziness or syncope.  Pt denies new neurological symptoms such as new headache, or facial or extremity weakness or numbness.  Pt denies polydipsia, polyuria, or low sugar episode.   Pt denies new neurological symptoms such as new headache, or facial or extremity weakness or numbness.   Pt states overall good compliance with meds, mostly trying to follow appropriate diet, with wt overall stable,  but little exercise however.. ADD med working well.  Is Jonathan Heath witness and plans to go [place] for disaster relief effort.    Past Medical History:  Diagnosis Date  . ADHD   . DISC DISEASE, LUMBAR   . DM2 (diabetes mellitus, type 2) (HCC)   . ERECTILE DYSFUNCTION   . FRACTURE, RIGHT LEG   . HYPERLIPIDEMIA   . Hypertension    Past Surgical History:  Procedure Laterality Date  . hx fx right leg s/p ORIF      reports that he has never smoked. He has never used smokeless tobacco. He reports that he drinks alcohol. His drug history is not on file. family history includes Benign prostatic hyperplasia in his father; Heart disease in his father; Hypertension in his other. Allergies  Allergen Reactions  . Levofloxacin Nausea Only  . Lisinopril Other (See Comments)    Dizzy, confused, nausea, palp's and sweats  . Doxycycline Palpitations    Increased heart rate, dizziness, nausea, vomiting.    Current Outpatient Prescriptions on File Prior to Visit  Medication Sig Dispense Refill  . albuterol (PROVENTIL HFA;VENTOLIN HFA) 108 (90 BASE) MCG/ACT inhaler Inhale 2 puffs into the lungs every 6 (six) hours as needed.    Marland Kitchen aspirin 81 MG EC tablet Take 81 mg by mouth daily.      Marland Kitchen atorvastatin (LIPITOR) 80 MG tablet TAKE 1 TABLET(80 MG) BY MOUTH DAILY 90 tablet 0    . cetirizine (ZYRTEC) 10 MG tablet Take 1 tablet (10 mg total) by mouth daily. 30 tablet 3  . cyclobenzaprine (FLEXERIL) 5 MG tablet Take 1 tablet (5 mg total) by mouth 3 (three) times daily as needed for muscle spasms. 60 tablet 1  . divalproex (DEPAKOTE) 250 MG 24 hr tablet Take 250 mg by mouth daily.      . fluticasone (FLONASE) 50 MCG/ACT nasal spray Place 2 sprays into the nose daily. 16 g 6  . metFORMIN (GLUCOPHAGE-XR) 500 MG 24 hr tablet TAKE 2 TABLETS BY MOUTH DAILY WITH BREAKFAST 180 tablet 0  . methylphenidate (RITALIN) 20 MG tablet Take 20 mg by mouth. 1 by mouth two times a day every morning and at noon     . naproxen (EC NAPROSYN) 500 MG EC tablet Take 1 tablet (500 mg total) by mouth 2 (two) times daily. 180 tablet 3  . ONE TOUCH ULTRA TEST test strip USE AS DIRECTED 300 each 3  . oxyCODONE (OXY IR/ROXICODONE) 5 MG immediate release tablet 1-2 tabs by mouth every 6 hrs as needed for pain 40 tablet 0  . sildenafil (VIAGRA) 100 MG tablet Take 0.5-1 tablets (50-100 mg total) by mouth daily as needed for erectile dysfunction. 8 tablet 11  . sulfamethoxazole-trimethoprim (BACTRIM,SEPTRA) 400-80 MG tablet TAKE 1 TABLET BY MOUTH TWICE DAILY 28 tablet 0  . traMADol (ULTRAM) 50 MG  tablet Take 1 tablet (50 mg total) by mouth every 6 (six) hours as needed for pain. 60 tablet 1  . triamcinolone (KENALOG) 0.1 % cream Apply topically 2 (two) times daily.       No current facility-administered medications on file prior to visit.    Review of Systems  Constitutional: Negative for other unusual diaphoresis or sweats HENT: Negative for ear discharge or swelling Eyes: Negative for other worsening visual disturbances Respiratory: Negative for stridor or other swelling  Gastrointestinal: Negative for worsening distension or other blood Genitourinary: Negative for retention or other urinary change Musculoskeletal: Negative for other MSK pain or swelling Skin: Negative for color change or other new  lesions Neurological: Negative for worsening tremors and other numbness  Psychiatric/Behavioral: Negative for worsening agitation or other fatigue All other system neg per pt    Objective:   Physical Exam BP 120/84   Pulse 92   Ht  (1.753 m)   Wt 187 lb (84.8 kg)   SpO2 100%   BMI 27.62 kg/m  VS noted,  Constitutional: Pt appears in NAD HENT: Head: NCAT.  Right Ear: External ear normal.  Left Ear: External ear normal.  Eyes: . Pupils are equal, round, and reactive to light. Conjunctivae and EOM are normal Nose: without d/c or deformity Neck: Neck supple. Gross normal ROM Cardiovascular: Normal rate and regular rhythm.   Pulmonary/Chest: Effort normal and breath sounds without rales or wheezing.  Abd:  Soft, NT, ND, + BS, no organomegaly Neurological: Pt is alert. At baseline orientation, motor grossly intact Skin: Skin is warm. No rashes, other new lesions, no LE edema Psychiatric: Pt behavior is normal without agitation  No other exam findings    Assessment & Plan:

## 2016-05-07 NOTE — Assessment & Plan Note (Signed)
stable overall by history and exam, recent data reviewed with pt, and pt to continue medical treatment as before,  to f/u any worsening symptoms or concerns Lab Results  Component Value Date   HGBA1C 7.5 (H) 09/16/2015

## 2016-05-07 NOTE — Assessment & Plan Note (Signed)
BP Readings from Last 3 Encounters:  05/04/16 120/84  03/03/16 118/84  09/16/15 124/88  stable overall by history and exam, recent data reviewed with pt, and pt to continue medical treatment as before,  to f/u any worsening symptoms or concerns

## 2016-05-07 NOTE — Assessment & Plan Note (Signed)
stable overall by history and exam, for lower chol/DM diet, and pt to continue medical treatment as before,  to f/u any worsening symptoms or concerns, for f/u lab

## 2016-05-12 ENCOUNTER — Other Ambulatory Visit (INDEPENDENT_AMBULATORY_CARE_PROVIDER_SITE_OTHER): Payer: Self-pay

## 2016-05-12 DIAGNOSIS — E119 Type 2 diabetes mellitus without complications: Secondary | ICD-10-CM

## 2016-05-12 LAB — LIPID PANEL
CHOLESTEROL: 154 mg/dL (ref 0–200)
HDL: 41.1 mg/dL (ref >39.00)
LDL CALC: 80 mg/dL (ref 0–99)
NonHDL: 112.69
TRIGLYCERIDES: 162 mg/dL — AB (ref 0.0–149.0)
Total CHOL/HDL Ratio: 4
VLDL: 32.4 mg/dL (ref 0.0–40.0)

## 2016-05-12 LAB — BASIC METABOLIC PANEL
BUN: 12 mg/dL (ref 6–23)
CALCIUM: 9.7 mg/dL (ref 8.4–10.5)
CHLORIDE: 100 meq/L (ref 96–112)
CO2: 30 meq/L (ref 19–32)
CREATININE: 1.17 mg/dL (ref 0.40–1.50)
GFR: 82.2 mL/min (ref >60.00)
GLUCOSE: 309 mg/dL — AB (ref 70–99)
Potassium: 4.2 mEq/L (ref 3.5–5.1)
SODIUM: 137 meq/L (ref 135–145)

## 2016-05-12 LAB — HEPATIC FUNCTION PANEL
ALBUMIN: 4.3 g/dL (ref 3.5–5.2)
ALK PHOS: 117 U/L (ref 39–117)
ALT: 12 U/L (ref 0–53)
AST: 14 U/L (ref 0–37)
BILIRUBIN DIRECT: 0.2 mg/dL (ref 0.0–0.3)
TOTAL PROTEIN: 7.3 g/dL (ref 6.0–8.3)
Total Bilirubin: 1.3 mg/dL — ABNORMAL HIGH (ref 0.2–1.2)

## 2016-05-12 LAB — HEMOGLOBIN A1C: HEMOGLOBIN A1C: 7.2 % — AB (ref 4.6–6.5)

## 2016-05-18 ENCOUNTER — Telehealth: Payer: Self-pay

## 2016-05-18 NOTE — Telephone Encounter (Signed)
LVM stating that the paperwork he dropped off has been signed and ready for pick up. It is at front desk.

## 2016-05-19 ENCOUNTER — Ambulatory Visit (INDEPENDENT_AMBULATORY_CARE_PROVIDER_SITE_OTHER): Payer: Self-pay | Admitting: Internal Medicine

## 2016-05-19 ENCOUNTER — Encounter: Payer: Self-pay | Admitting: Internal Medicine

## 2016-05-19 VITALS — BP 134/90 | HR 113 | Temp 99.0°F | Ht 69.0 in | Wt 183.0 lb

## 2016-05-19 DIAGNOSIS — R05 Cough: Secondary | ICD-10-CM

## 2016-05-19 DIAGNOSIS — R059 Cough, unspecified: Secondary | ICD-10-CM | POA: Insufficient documentation

## 2016-05-19 DIAGNOSIS — R062 Wheezing: Secondary | ICD-10-CM | POA: Insufficient documentation

## 2016-05-19 MED ORDER — ALBUTEROL SULFATE HFA 108 (90 BASE) MCG/ACT IN AERS: 2.0000 | INHALATION_SPRAY | Freq: Four times a day (QID) | RESPIRATORY_TRACT | 1 refills | Status: AC | PRN

## 2016-05-19 MED ORDER — PREDNISONE 10 MG PO TABS: ORAL_TABLET | ORAL | 0 refills | Status: AC

## 2016-05-19 MED ORDER — METHYLPREDNISOLONE ACETATE 80 MG/ML IJ SUSP
80.0000 mg | Freq: Once | INTRAMUSCULAR | Status: AC
  Administered 2016-05-19: 80 mg via INTRAMUSCULAR

## 2016-05-19 MED ORDER — CEPHALEXIN 500 MG PO CAPS: 500.0000 mg | ORAL_CAPSULE | Freq: Three times a day (TID) | ORAL | 0 refills | Status: AC

## 2016-05-19 MED ORDER — HYDROCODONE-HOMATROPINE 5-1.5 MG/5ML PO SYRP: 5.0000 mL | ORAL_SOLUTION | Freq: Four times a day (QID) | ORAL | 0 refills | Status: AC | PRN

## 2016-05-19 NOTE — Progress Notes (Signed)
Subjective:    Patient ID: Jonathan Heath, male    DOB: 02-Jan-1956, 61 y.o.   MRN: [id number]  HPI  Here to f/u, selfpay/selfinsured and wants to avoid increased cost if possible. Here with acute onset mild to mod 2-3 days ST, HA, general weakness and malaise, with prod cough greenish sputum as well as bloody nasal drainage as well as last night onset mild sob/wheezing/tightness, but Pt denies chest pain, orthopnea, PND, increased LE swelling, palpitations, dizziness or syncope.  Pt denies new neurological symptoms such as new headache, or facial or extremity weakness or numbness   Pt denies polydipsia, polyuria. Wife was ill for several days after recent ill grandchild visit, with his symptoms starting a couple of days later.   Past Medical History:  Diagnosis Date  . ADHD   . DISC DISEASE, LUMBAR   . DM2 (diabetes mellitus, type 2) (HCC)   . ERECTILE DYSFUNCTION   . FRACTURE, RIGHT LEG   . HYPERLIPIDEMIA   . Hypertension    Past Surgical History:  Procedure Laterality Date  . hx fx right leg s/p ORIF      reports that he has never smoked. He has never used smokeless tobacco. He reports that he drinks alcohol. His drug history is not on file. family history includes Benign prostatic hyperplasia in his father; Heart disease in his father; Hypertension in his other. Allergies  Allergen Reactions  . Levofloxacin Nausea Only  . Lisinopril Other (See Comments)    Dizzy, confused, nausea, palp's and sweats  . Doxycycline Palpitations    Increased heart rate, dizziness, nausea, vomiting.    Current Outpatient Prescriptions on File Prior to Visit  Medication Sig Dispense Refill  . albuterol (PROVENTIL HFA;VENTOLIN HFA) 108 (90 BASE) MCG/ACT inhaler Inhale 2 puffs into the lungs every 6 (six) hours as needed.    Marland Kitchen. aspirin 81 MG EC tablet Take 81 mg by mouth daily.      Marland Kitchen. atorvastatin (LIPITOR) 80 MG tablet TAKE 1 TABLET(80 MG) BY MOUTH DAILY 90 tablet 0  . cetirizine (ZYRTEC) 10 MG  tablet Take 1 tablet (10 mg total) by mouth daily. 30 tablet 3  . cyclobenzaprine (FLEXERIL) 5 MG tablet Take 1 tablet (5 mg total) by mouth 3 (three) times daily as needed for muscle spasms. 60 tablet 1  . divalproex (DEPAKOTE) 250 MG 24 hr tablet Take 250 mg by mouth daily.      . fluticasone (FLONASE) 50 MCG/ACT nasal spray Place 2 sprays into the nose daily. 16 g 6  . metFORMIN (GLUCOPHAGE-XR) 500 MG 24 hr tablet TAKE 2 TABLETS BY MOUTH DAILY WITH BREAKFAST 180 tablet 0  . methylphenidate (RITALIN) 20 MG tablet Take 20 mg by mouth. 1 by mouth two times a day every morning and at noon     . naproxen (EC NAPROSYN) 500 MG EC tablet Take 1 tablet (500 mg total) by mouth 2 (two) times daily. 180 tablet 3  . ONE TOUCH ULTRA TEST test strip USE AS DIRECTED 300 each 3  . oxyCODONE (OXY IR/ROXICODONE) 5 MG immediate release tablet 1-2 tabs by mouth every 6 hrs as needed for pain 40 tablet 0  . sildenafil (VIAGRA) 100 MG tablet Take 0.5-1 tablets (50-100 mg total) by mouth daily as needed for erectile dysfunction. 8 tablet 11  . sulfamethoxazole-trimethoprim (BACTRIM,SEPTRA) 400-80 MG tablet TAKE 1 TABLET BY MOUTH TWICE DAILY 28 tablet 0  . traMADol (ULTRAM) 50 MG tablet Take 1 tablet (50 mg total) by mouth every  6 (six) hours as needed for pain. 60 tablet 1  . triamcinolone (KENALOG) 0.1 % cream Apply topically 2 (two) times daily.       No current facility-administered medications on file prior to visit.    Review of Systems All otherwise neg per pt     Objective:   Physical Exam BP 134/90   Pulse (!) 113   Temp 99 F (37.2 C) (Oral)   Ht 5\' 9"  (1.753 m)   Wt 183 lb (83 kg)   SpO2 99%   BMI 27.02 kg/m  VS noted, mild to mod ill appearing, fatigued, no accessory muscle use Constitutional: Pt appears in NAD HENT: Head: NCAT.  Right Ear: External ear normal.  Left Ear: External ear normal.  Eyes: . Pupils are equal, round, and reactive to light. Conjunctivae and EOM are normal Nose:  without d/c or deformity Bilat tm's with mild erythema.  Max sinus areas non tender.  Pharynx with mild erythema, no exudate Neck: Neck supple. Gross normal ROM Cardiovascular: Normal rate and regular rhythm.   Pulmonary/Chest: Effort normal and breath sounds  decreased without rales but with several scattered rhonchi and bibasilar few wheezing.  Neurological: Pt is alert. At baseline orientation, motor grossly intact Skin: Skin is warm. No rashes, other new lesions, no LE edema Psychiatric: Pt behavior is normal without agitation  No other exam findings  Lab Results  Component Value Date   WBC 8.8 09/16/2015   HGB 14.1 09/16/2015   HCT 41.8 09/16/2015   PLT 166.0 09/16/2015   GLUCOSE 309 (H) 05/12/2016   CHOL 154 05/12/2016   TRIG 162.0 (H) 05/12/2016   HDL 41.10 05/12/2016   LDLDIRECT 118.0 09/16/2015   LDLCALC 80 05/12/2016   ALT 12 05/12/2016   AST 14 05/12/2016   NA 137 05/12/2016   K 4.2 05/12/2016   CL 100 05/12/2016   CREATININE 1.17 05/12/2016   BUN 12 05/12/2016   CO2 30 05/12/2016   TSH 1.05 09/16/2015   PSA 1.11 09/16/2015   HGBA1C 7.2 (H) 05/12/2016   MICROALBUR <0.7 09/16/2015       Assessment & Plan:

## 2016-05-19 NOTE — Assessment & Plan Note (Signed)
Mild to mod, c/w bronchitis vs pna, declines cxr for now, for antibx course, cough med prn, to f/u any worsening symptoms or concerns

## 2016-05-19 NOTE — Assessment & Plan Note (Signed)
Mild to mod, for depomedrol 80 IM, predpac asd, and inhaler prn,  to f/u any worsening symptoms or concerns

## 2016-05-19 NOTE — Progress Notes (Signed)
Pre visit review using our clinic review tool, if applicable. No additional management support is needed unless otherwise documented below in the visit note. 

## 2016-05-19 NOTE — Patient Instructions (Signed)
You had the steroid shot today  Please take all new medication as prescribed - the antibiotic, cough medicine if needed, prednisone and the inhaler  If the cough medicine prescribed is too expensive, you can try OTC Delsym as well  You can also take OTC Mucinex (or it's generic off brand) for congestion, and tylenol as needed for pain.  Please continue all other medications as before, and refills have been done if requested.  Please have the pharmacy call with any other refills you may need.  Please keep your appointments with your specialists as you may have planned

## 2016-07-03 ENCOUNTER — Encounter (HOSPITAL_COMMUNITY): Payer: Self-pay | Admitting: Emergency Medicine

## 2016-07-03 ENCOUNTER — Ambulatory Visit (HOSPITAL_COMMUNITY)
Admission: EM | Admit: 2016-07-03 | Discharge: 2016-07-03 | Disposition: A | Discharge status: Home / Self Care | Payer: Self-pay | Attending: Family Medicine | Admitting: Family Medicine

## 2016-07-03 DIAGNOSIS — H6123 Impacted cerumen, bilateral: Secondary | ICD-10-CM

## 2016-07-03 NOTE — Discharge Instructions (Signed)
You have a cerumen impaction, your ears have been irrigated here in the clinic. If you continued to have symptoms, recurrence of symptoms, follow up with her primary care provider, or return to clinic as needed.

## 2016-07-03 NOTE — ED Provider Notes (Signed)
CSN: [id number]     Arrival date & time 07/03/16  1718 History   First MD Initiated Contact with Patient 07/03/16 1809     Chief Complaint  Patient presents with  . Ear Fullness   (Consider location/radiation/quality/duration/timing/severity/associated sxs/prior Treatment) Rico JunkerCraig Satchell is a 61 y.o. male who [facility] with a chief complaint of bilateral ear pressure and ear wax buildup   The history is provided by the patient.  Otalgia  Location:  Bilateral Behind ear:  No abnormality Quality:  Dull and pressure Severity:  Mild Onset quality:  Gradual Duration:  2 weeks Timing:  Constant Progression:  Unchanged Chronicity:  New Context: water in ear   Relieved by:  Nothing Worsened by:  Nothing Ineffective treatments: q tips and ear wax remover. Associated symptoms: no congestion, no ear discharge, no hearing loss, no neck pain, no rash, no tinnitus and no vomiting     Past Medical History:  Diagnosis Date  . ADHD   . DISC DISEASE, LUMBAR   . DM2 (diabetes mellitus, type 2) (HCC)   . ERECTILE DYSFUNCTION   . FRACTURE, RIGHT LEG   . HYPERLIPIDEMIA   . Hypertension    Past Surgical History:  Procedure Laterality Date  . hx fx right leg s/p ORIF     Family History  Problem Relation Age of Onset  . Benign prostatic hyperplasia Father   . Heart disease Father        mature heart disease  . Hypertension Other    Social History  Substance Use Topics  . Smoking status: Never Smoker  . Smokeless tobacco: Never Used  . Alcohol use Yes     Comment: rare at the superbowl or summer picnics    Review of Systems  HENT: Positive for ear pain. Negative for congestion, ear discharge, hearing loss, sinus pain, sinus pressure and tinnitus.   Respiratory: Negative.   Cardiovascular: Negative.   Gastrointestinal: Negative.  Negative for vomiting.  Musculoskeletal: Negative for neck pain and neck stiffness.  Skin: Negative.  Negative for rash.   Neurological: Negative.     Allergies  Levofloxacin; Lisinopril; and Doxycycline  Home Medications   Prior to Admission medications   Medication Sig Start Date End Date Taking? Authorizing Provider  atorvastatin (LIPITOR) 80 MG tablet TAKE 1 TABLET(80 MG) BY MOUTH DAILY 05/01/16  Yes Corwin LevinsJohn, James W, MD  metFORMIN (GLUCOPHAGE-XR) 500 MG 24 hr tablet TAKE 2 TABLETS BY MOUTH DAILY WITH BREAKFAST 11/10/15  Yes Corwin LevinsJohn, James W, MD  methylphenidate (RITALIN) 20 MG tablet Take 20 mg by mouth. 1 by mouth two times a day every morning and at noon    Yes [provider]  albuterol (PROVENTIL HFA;VENTOLIN HFA) 108 (90 Base) MCG/ACT inhaler Inhale 2 puffs into the lungs every 6 (six) hours as needed. 05/19/16   Corwin LevinsJohn, James W, MD  aspirin 81 MG EC tablet Take 81 mg by mouth daily.      [provider]  cetirizine (ZYRTEC) 10 MG tablet Take 1 tablet (10 mg total) by mouth daily. 02/09/12   Lorre MunroeBaity, Regina W, NP  cyclobenzaprine (FLEXERIL) 5 MG tablet Take 1 tablet (5 mg total) by mouth 3 (three) times daily as needed for muscle spasms. 10/04/11   Corwin LevinsJohn, James W, MD  divalproex (DEPAKOTE) 250 MG 24 hr tablet Take 250 mg by mouth daily.      [provider]  fluticasone (FLONASE) 50 MCG/ACT nasal spray Place 2 sprays into the nose  daily. 02/09/12   Lorre Munroe, NP  naproxen (EC NAPROSYN) 500 MG EC tablet Take 1 tablet (500 mg total) by mouth 2 (two) times daily. 10/18/12   Corwin Levins, MD  ONE TOUCH ULTRA TEST test strip USE AS DIRECTED 05/24/11   Corwin Levins, MD  oxyCODONE (OXY IR/ROXICODONE) 5 MG immediate release tablet 1-2 tabs by mouth every 6 hrs as needed for pain 10/23/13   Corwin Levins, MD  predniSONE (DELTASONE) 10 MG tablet 3 tabs by mouth per day for 3 days,2tabs per day for 3 days,1tab per day for 3 days 05/19/16   Corwin Levins, MD  sildenafil (VIAGRA) 100 MG tablet Take 0.5-1 tablets (50-100 mg total) by mouth daily as needed for erectile dysfunction. 09/16/15   Corwin Levins, MD  sulfamethoxazole-trimethoprim (BACTRIM,SEPTRA) 400-80 MG tablet TAKE 1 TABLET BY MOUTH TWICE DAILY 04/17/16   Corwin Levins, MD  traMADol (ULTRAM) 50 MG tablet Take 1 tablet (50 mg total) by mouth every 6 (six) hours as needed for pain. 10/04/11   Corwin Levins, MD  triamcinolone (KENALOG) 0.1 % cream Apply topically 2 (two) times daily.      [provider]   Meds Ordered and Administered this Visit  Medications - No data to display  BP 127/71 (BP Location: Left Arm)   Pulse 87   Temp 97.7 F (36.5 C) (Oral)   Resp 18   SpO2 100%  No data found.   Physical Exam  Constitutional: He is oriented to person, place, and time. He appears well-developed and well-nourished. No distress.  HENT:  Head: Normocephalic and atraumatic.  Right Ear: External ear normal.  Left Ear: External ear normal.  Bilateral cerumen impaction  Eyes: Conjunctivae are normal.  Neck: Normal range of motion.  Cardiovascular: Normal rate and regular rhythm.   Pulmonary/Chest: Effort normal and breath sounds normal.  Neurological: He is alert and oriented to person, place, and time.  Skin: Skin is warm and dry. Capillary refill takes less than 2 seconds. He is not diaphoretic.  Psychiatric: He has a normal mood and affect. His behavior is normal.  Nursing note and vitals reviewed.   Urgent Care Course     Procedures (including critical care time)  Labs Review Labs Reviewed - No data to display  Imaging Review No results found.     MDM   1. Bilateral impacted cerumen     Ears cleaned in clinic, counseling on follow-up care, return as needed.     Dorena Bodo, NP 07/03/16 2101

## 2016-07-03 NOTE — ED Triage Notes (Signed)
Pt here for bilateral ear fullness onset 2 weeks associated w/decerased hearing  Denies fevers, cold sx  A&O x4... NAD... Ambulatory

## 2016-07-31 ENCOUNTER — Telehealth: Payer: Self-pay | Admitting: Internal Medicine

## 2016-07-31 NOTE — Telephone Encounter (Signed)
Patient is trying to get atorvastatin cheaper.  Would like sent to [facility].

## 2016-08-01 MED ORDER — ATORVASTATIN CALCIUM 80 MG PO TABS: ORAL_TABLET | ORAL | 2 refills | Status: AC

## 2016-08-01 NOTE — Telephone Encounter (Signed)
Done

## 2016-09-14 ENCOUNTER — Telehealth: Payer: Self-pay | Admitting: Internal Medicine

## 2016-09-14 MED ORDER — SILDENAFIL CITRATE 100 MG PO TABS: 50.0000 mg | ORAL_TABLET | Freq: Every day | ORAL | 0 refills | Status: AC | PRN

## 2016-09-14 MED ORDER — SILDENAFIL CITRATE 100 MG PO TABS: 50.0000 mg | ORAL_TABLET | Freq: Every day | ORAL | 0 refills | Status: DC | PRN

## 2016-09-14 NOTE — Addendum Note (Signed)
Addended by: Deatra JamesBRAND, LUCY M on: 09/14/2016 11:02 AM   Modules accepted: Orders

## 2016-09-14 NOTE — Telephone Encounter (Addendum)
30 script sent to local pharmacy.Marland Kitchen.Raechel Chute/lmb

## 2016-09-14 NOTE — Telephone Encounter (Signed)
Pt called for a refill of his sildenafil (VIAGRA) 100 MG tablet  Had 85mo fu on 04/2016 Please send to [facility] Please advise

## 2016-11-02 ENCOUNTER — Ambulatory Visit: Payer: Self-pay | Admitting: Internal Medicine

## 2017-01-11 ENCOUNTER — Ambulatory Visit: Payer: Self-pay | Admitting: Internal Medicine

## 2017-07-10 ENCOUNTER — Other Ambulatory Visit: Payer: Self-pay | Admitting: Internal Medicine

## 2017-07-13 ENCOUNTER — Telehealth: Payer: Self-pay | Admitting: Internal Medicine

## 2017-07-13 NOTE — Telephone Encounter (Signed)
Patient is calling and would like to know why this has been denied. Please contact pt.

## 2017-07-13 NOTE — Telephone Encounter (Signed)
Informed patient that he needs an appointment,  He will call back to make an appointment. Refill will be sent in once appt is made.  Please make appt for CPE

## 2017-08-08 ENCOUNTER — Ambulatory Visit: Payer: Self-pay | Admitting: Internal Medicine

## 2017-08-20 ENCOUNTER — Ambulatory Visit: Payer: Self-pay | Admitting: Internal Medicine

## 2018-02-12 DIAGNOSIS — N529 Male erectile dysfunction, unspecified: Secondary | ICD-10-CM | POA: Diagnosis not present

## 2018-02-12 DIAGNOSIS — E1169 Type 2 diabetes mellitus with other specified complication: Secondary | ICD-10-CM | POA: Diagnosis not present

## 2018-02-12 DIAGNOSIS — E785 Hyperlipidemia, unspecified: Secondary | ICD-10-CM | POA: Diagnosis not present

## 2018-03-12 DIAGNOSIS — Z125 Encounter for screening for malignant neoplasm of prostate: Secondary | ICD-10-CM | POA: Diagnosis not present

## 2018-03-12 DIAGNOSIS — E1169 Type 2 diabetes mellitus with other specified complication: Secondary | ICD-10-CM | POA: Diagnosis not present

## 2018-03-12 DIAGNOSIS — Z23 Encounter for immunization: Secondary | ICD-10-CM | POA: Diagnosis not present

## 2018-03-12 DIAGNOSIS — N529 Male erectile dysfunction, unspecified: Secondary | ICD-10-CM | POA: Diagnosis not present

## 2018-03-12 DIAGNOSIS — E785 Hyperlipidemia, unspecified: Secondary | ICD-10-CM | POA: Diagnosis not present

## 2018-03-12 DIAGNOSIS — Z0000 Encounter for general adult medical examination without abnormal findings: Secondary | ICD-10-CM | POA: Diagnosis not present

## 2018-03-16 ENCOUNTER — Encounter: Payer: Self-pay | Admitting: Gastroenterology

## 2018-04-17 DIAGNOSIS — R030 Elevated blood-pressure reading, without diagnosis of hypertension: Secondary | ICD-10-CM | POA: Diagnosis not present

## 2018-04-17 DIAGNOSIS — E785 Hyperlipidemia, unspecified: Secondary | ICD-10-CM | POA: Diagnosis not present

## 2018-04-17 DIAGNOSIS — G44201 Tension-type headache, unspecified, intractable: Secondary | ICD-10-CM | POA: Diagnosis not present

## 2018-04-17 DIAGNOSIS — E1169 Type 2 diabetes mellitus with other specified complication: Secondary | ICD-10-CM | POA: Diagnosis not present

## 2018-05-22 DIAGNOSIS — E1169 Type 2 diabetes mellitus with other specified complication: Secondary | ICD-10-CM | POA: Diagnosis not present

## 2018-05-31 DIAGNOSIS — E1169 Type 2 diabetes mellitus with other specified complication: Secondary | ICD-10-CM | POA: Diagnosis not present

## 2018-07-12 DIAGNOSIS — E785 Hyperlipidemia, unspecified: Secondary | ICD-10-CM | POA: Diagnosis not present

## 2018-07-12 DIAGNOSIS — E1169 Type 2 diabetes mellitus with other specified complication: Secondary | ICD-10-CM | POA: Diagnosis not present

## 2018-07-16 ENCOUNTER — Ambulatory Visit (INDEPENDENT_AMBULATORY_CARE_PROVIDER_SITE_OTHER): Payer: BC Managed Care – PPO | Admitting: Family Medicine

## 2018-07-16 ENCOUNTER — Ambulatory Visit (INDEPENDENT_AMBULATORY_CARE_PROVIDER_SITE_OTHER)
Admission: RE | Admit: 2018-07-16 | Discharge: 2018-07-16 | Disposition: A | Discharge status: Home / Self Care | Payer: BC Managed Care – PPO | Source: Ambulatory Visit | Attending: Family Medicine | Admitting: Family Medicine

## 2018-07-16 ENCOUNTER — Encounter: Payer: Self-pay | Admitting: Family Medicine

## 2018-07-16 ENCOUNTER — Other Ambulatory Visit: Payer: Self-pay

## 2018-07-16 ENCOUNTER — Ambulatory Visit: Payer: Self-pay

## 2018-07-16 VITALS — BP 120/74 | HR 97 | Ht 69.0 in

## 2018-07-16 DIAGNOSIS — M47812 Spondylosis without myelopathy or radiculopathy, cervical region: Secondary | ICD-10-CM | POA: Diagnosis not present

## 2018-07-16 DIAGNOSIS — M5030 Other cervical disc degeneration, unspecified cervical region: Secondary | ICD-10-CM

## 2018-07-16 DIAGNOSIS — M25511 Pain in right shoulder: Secondary | ICD-10-CM

## 2018-07-16 DIAGNOSIS — G8929 Other chronic pain: Secondary | ICD-10-CM | POA: Diagnosis not present

## 2018-07-16 DIAGNOSIS — M7551 Bursitis of right shoulder: Secondary | ICD-10-CM | POA: Diagnosis not present

## 2018-07-16 MED ORDER — GABAPENTIN 100 MG PO CAPS: 200.0000 mg | ORAL_CAPSULE | Freq: Every day | ORAL | 0 refills | Status: AC

## 2018-07-16 NOTE — Patient Instructions (Addendum)
Good to see you.  Ice 20 minutes 2 times daily. Usually after activity and before bed. Exercises 3 times a week.  Voltaren gel 2x daily Gabapentin 200 mg at night  Vitamin D 2000 IU daily  See me again in 6 weeks

## 2018-07-16 NOTE — Assessment & Plan Note (Signed)
Concerned that patient does have moderate degenerative cervical disease that I think is contributing.  X-rays ordered today.  Gabapentin 200 mg at night.  Discussed icing regimen and home exercise.  Discussed topical anti-inflammatories.  Patient will give gabapentin.  Follow-up again in 6 weeks

## 2018-07-16 NOTE — Progress Notes (Signed)
[facility] [address] [place], [place] [address] Phone: [phone] Subjective:   Jonathan Heath, am serving as a scribe for Dr. Hulan Saas.  I'm seeing this patient by the request  of: Biagio Borg, MD    CC: right shoulder pain  LOV:[id number]   02/16/2014: Patient did have more of a subacromial bursitis. Patient was given different treatment options and patient did elect to have another injection today. We discussed icing regimen, home exercises, and patient declined formal physical therapy. We discussed over-the-counter medications a can make some benefit as well. Differential includes cervical radiculopathy but only mild osteophytic changes seen previously. Patient is not having much radiation or numbness in the hand. Patient will come back and see me again in 3 weeks to make sure that this pain is completely resolved. With patient's type 2 diabetes adhesive capsulitis is also within the differential but secondary to fall range of motion and this is  Not likely.  Update 07/16/2018: Jonathan Heath is a 63 y.o. male coming in with complaint of right shoulder pain. Having pain over superior aspect of shoulder that radiates into scapula. Would like to work out but unable to do a push up or over head press. Does use salonpas prn. Denies any radiating symptoms. States that he did not have a lot of relief from the injection and has been ignoring pain.  Does have some mild radicular symptoms as well.  Describes the pain as a dull, throbbing aching sensation when that occurs and feels better when he has his arm elevated.     Past Medical History:  Diagnosis Date  . ADHD   . DISC DISEASE, LUMBAR   . DM2 (diabetes mellitus, type 2) ([place])   . ERECTILE DYSFUNCTION   . FRACTURE, RIGHT LEG   . HYPERLIPIDEMIA   . Hypertension    Past Surgical History:  Procedure Laterality Date  . hx fx right leg s/p ORIF     Social History   Socioeconomic History  .  Marital status: Married    Spouse name: Not on file  . Number of children: Not on file  . Years of education: Not on file  . Highest education level: Not on file  Occupational History  . Occupation: Tree surgeon: [employer]  . Financial resource strain: Not on file  . Food insecurity    Worry: Not on file    Inability: Not on file  . Transportation needs    Medical: Not on file    Non-medical: Not on file  Tobacco Use  . Smoking status: Never Smoker  . Smokeless tobacco: Never Used  Substance and Sexual Activity  . Alcohol use: Yes    Comment: rare at the superbowl or summer picnics  . Drug use: Not on file  . Sexual activity: Not on file  Lifestyle  . Physical activity    Days per week: Not on file    Minutes per session: Not on file  . Stress: Not on file  Relationships  . Social Herbalist on phone: Not on file    Gets together: Not on file    Attends religious service: Not on file    Active member of club or organization: Not on file    Attends meetings of clubs or organizations: Not on file    Relationship status: Not on file  Other Topics Concern  . Not  on file  Social History Narrative  . Not on file   Allergies  Allergen Reactions  . Levofloxacin Nausea Only  . Lisinopril Other (See Comments)    Dizzy, confused, nausea, palp's and sweats  . Doxycycline Palpitations    Increased heart rate, dizziness, nausea, vomiting.    Family History  Problem Relation Age of Onset  . Benign prostatic hyperplasia Father   . Heart disease Father        mature heart disease  . Hypertension Other     Current Outpatient Medications (Endocrine & Metabolic):  .  metFORMIN (GLUCOPHAGE-XR) 500 MG 24 hr tablet, TAKE 2 TABLETS BY MOUTH DAILY WITH BREAKFAST .  predniSONE (DELTASONE) 10 MG tablet, 3 tabs by mouth per day for 3 days,2tabs per day for 3 days,1tab per day for 3 days  Current Outpatient  Medications (Cardiovascular):  .  atorvastatin (LIPITOR) 80 MG tablet, TAKE 1 TABLET(80 MG) BY MOUTH DAILY .  sildenafil (VIAGRA) 100 MG tablet, Take 0.5-1 tablets (50-100 mg total) by mouth daily as needed for erectile dysfunction.  Current Outpatient Medications (Respiratory):  .  albuterol (PROVENTIL HFA;VENTOLIN HFA) 108 (90 Base) MCG/ACT inhaler, Inhale 2 puffs into the lungs every 6 (six) hours as needed. .  cetirizine (ZYRTEC) 10 MG tablet, Take 1 tablet (10 mg total) by mouth daily. .  fluticasone (FLONASE) 50 MCG/ACT nasal spray, Place 2 sprays into the nose daily.  Current Outpatient Medications (Analgesics):  .  aspirin 81 MG EC tablet, Take 81 mg by mouth daily.   .  naproxen (EC NAPROSYN) 500 MG EC tablet, Take 1 tablet (500 mg total) by mouth 2 (two) times daily. Marland Kitchen.  oxyCODONE (OXY IR/ROXICODONE) 5 MG immediate release tablet, 1-2 tabs by mouth every 6 hrs as needed for pain .  traMADol (ULTRAM) 50 MG tablet, Take 1 tablet (50 mg total) by mouth every 6 (six) hours as needed for pain.   Current Outpatient Medications (Other):  .  cyclobenzaprine (FLEXERIL) 5 MG tablet, Take 1 tablet (5 mg total) by mouth 3 (three) times daily as needed for muscle spasms. .  divalproex (DEPAKOTE) 250 MG 24 hr tablet, Take 250 mg by mouth daily.   .  methylphenidate (RITALIN) 20 MG tablet, Take 20 mg by mouth. 1 by mouth two times a day every morning and at noon  .  ONE TOUCH ULTRA TEST test strip, USE AS DIRECTED .  sulfamethoxazole-trimethoprim (BACTRIM,SEPTRA) 400-80 MG tablet, TAKE 1 TABLET BY MOUTH TWICE DAILY .  triamcinolone (KENALOG) 0.1 % cream, Apply topically 2 (two) times daily.   Marland Kitchen.  gabapentin (NEURONTIN) 100 MG capsule, Take 2 capsules (200 mg total) by mouth at bedtime.    Past medical history, social, surgical and family history all reviewed in electronic medical record.  Heath pertanent information unless stated regarding to the chief complaint.   Review of Systems:  Heath  headache, visual changes, nausea, vomiting, diarrhea, constipation, dizziness, abdominal pain, skin rash, fevers, chills, night sweats, weight loss, swollen lymph nodes, body aches, joint swelling, muscle aches, chest pain, shortness of breath, mood changes.   Objective  Blood pressure 120/74, pulse 97, height 5\' 9"  (1.753 m), SpO2 97 %.    General: Heath apparent distress alert and oriented x3 mood and affect normal, dressed appropriately.  HEENT: Pupils equal, extraocular movements intact  Respiratory: Patient's speak in full sentences and does not appear short of breath  Cardiovascular: Heath lower extremity edema, non tender, Heath erythema  Skin: Warm dry intact with  Heath signs of infection or rash on extremities or on axial skeleton.  Abdomen: Soft nontender  Neuro: Cranial nerves II through XII are intact, neurovascularly intact in all extremities with 2+ DTRs and 2+ pulses.  Lymph: Heath lymphadenopathy of posterior or anterior cervical chain or axillae bilaterally.  Gait normal with good balance and coordination.  MSK:  Non tender with full range of motion and good stability and symmetric strength and tone of  elbows, wrist, hip, knee and ankles bilaterally.  Shoulder: Right Inspection reveals Heath abnormalities, atrophy or asymmetry. Palpation is normal with Heath tenderness over AC joint or bicipital groove. ROM is full in all planes passively. Rotator cuff strength normal throughout. signs of impingement with positive Neer and Hawkin's tests, but negative empty can sign. Speeds and Yergason's tests normal. Heath labral pathology noted with negative Obrien's, negative clunk and good stability. Normal scapular function observed. Heath painful arc and Heath drop arm sign. Heath apprehension sign  MSK US performed of: Right This study was ordered, performed, and interpreted by Terrilee FilesZach Deshaun Schou D.O.  Shoulder:   Supraspinatus:  Appears normal on long and transverse views, Bursal bulge seen with shoulder abduction  on impingement view. Infraspinatus:  Appears normal on long and transverse views. Significant increase in Doppler flow Subscapularis:  Appears normal on long and transverse views. Positive bursa Teres Minor:  Appears normal on long and transverse views. AC joint:  Capsule undistended, Heath geyser sign. Glenohumeral Joint:  Appears normal without effusion. Glenoid Labrum:  Intact without visualized tears. Biceps Tendon:  Appears normal on long and transverse views, Heath fraying of tendon, tendon located in intertubercular groove, Heath subluxation with shoulder internal or external rotation.  Impression: Subacromial bursitis  Procedure: Real-time Ultrasound Guided Injection of right glenohumeral joint Device: GE Logiq E  Ultrasound guided injection is preferred based studies that show increased duration, increased effect, greater accuracy, decreased procedural pain, increased response rate with ultrasound guided versus blind injection.  Verbal informed consent obtained.  Time-out conducted.  Noted Heath overlying erythema, induration, or other signs of local infection.  Skin prepped in a sterile fashion.  Local anesthesia: Topical Ethyl chloride.  With sterile technique and under real time ultrasound guidance:  Joint visualized.  23g 1  inch needle inserted posterior approach. Pictures taken for needle placement. Patient did have injection of 2 cc of 1% lidocaine, 2 cc of 0.5% Marcaine, and 1.0 cc of Kenalog 40 mg/dL. Completed without difficulty  Pain immediately resolved suggesting accurate placement of the medication.  Advised to call if fevers/chills, erythema, induration, drainage, or persistent bleeding.  Images permanently stored and available for review in the ultrasound unit.  Impression: Technically successful ultrasound guided injection.    Impression and Recommendations:     This case required medical decision making of moderate complexity. The above documentation has been reviewed  and is accurate and complete Judi SaaZachary M Manar Smalling, DO       Note: This dictation was prepared with Dragon dictation along with smaller phrase technology. Any transcriptional errors that result from this process are unintentional.

## 2018-07-16 NOTE — Assessment & Plan Note (Signed)
Injected shoulder again today.  Patient tolerated the procedure well.  I am concerned more for cervical radiculopathy that is likely contributing.  Gabapentin given today.  Discussed the over-the-counter oral anti-inflammatories if needed.  Patient is going to try a topical anti-inflammatory.  Discussed icing regimen and home exercises.  Follow-up again in 6 weeks

## 2018-09-03 ENCOUNTER — Ambulatory Visit: Payer: BC Managed Care – PPO | Admitting: Family Medicine

## 2018-09-17 ENCOUNTER — Ambulatory Visit: Payer: BC Managed Care – PPO | Admitting: Family Medicine

## 2018-09-17 ENCOUNTER — Other Ambulatory Visit: Payer: Self-pay

## 2018-09-17 ENCOUNTER — Encounter: Payer: Self-pay | Admitting: Family Medicine

## 2018-09-17 DIAGNOSIS — M5030 Other cervical disc degeneration, unspecified cervical region: Secondary | ICD-10-CM

## 2018-09-17 DIAGNOSIS — M7551 Bursitis of right shoulder: Secondary | ICD-10-CM | POA: Diagnosis not present

## 2018-09-17 DIAGNOSIS — M5416 Radiculopathy, lumbar region: Secondary | ICD-10-CM | POA: Diagnosis not present

## 2018-09-17 NOTE — Assessment & Plan Note (Signed)
History of radicular symptoms.  Some more tightness on the left side with straight leg test but no radicular symptoms at this moment.  Discussed with patient about icing regimen, core strengthening, stability exercises.  Patient is to increase activity as tolerated.  Follow-up with me again in 4 to 8 weeks

## 2018-09-17 NOTE — Patient Instructions (Signed)
Take 0-3 gabapentin at night Neck and shoulder looks great Exercise 3 times a week If needed see me again in 2-3 months

## 2018-09-17 NOTE — Progress Notes (Signed)
[facility] [place], [place] [address] Phone: [phone] Subjective:     CC: Right shoulder pain follow-up   IBB:[id number]   07/16/2018 Concerned that patient does have moderate degenerative cervical disease that I think is contributing.  X-rays ordered today.  Gabapentin 200 mg at night.  Discussed icing regimen and home exercise.  Discussed topical anti-inflammatories.  Patient will give gabapentin.  Follow-up again in 6 weeks  09/17/2018 Damaso Laday is a 63 y.o. male coming in with complaint of right shoulder pain. Injection last visit. Doing exercises and using Voltaren. Has a herniated disc in his back that is causing tightness in his back.       Past Medical History:  Diagnosis Date  . ADHD   . DISC DISEASE, LUMBAR   . DM2 (diabetes mellitus, type 2) ([facility])   . ERECTILE DYSFUNCTION   . FRACTURE, RIGHT LEG   . HYPERLIPIDEMIA   . Hypertension    Past Surgical History:  Procedure Laterality Date  . hx fx right leg s/p ORIF     Social History   Socioeconomic History  . Marital status: Married    Spouse name: Not on file  . Number of children: Not on file  . Years of education: Not on file  . Highest education level: Not on file  Occupational History  . Occupation: Tree surgeon: [employer]  . Financial resource strain: Not on file  . Food insecurity    Worry: Not on file    Inability: Not on file  . Transportation needs    Medical: Not on file    Non-medical: Not on file  Tobacco Use  . Smoking status: Never Smoker  . Smokeless tobacco: Never Used  Substance and Sexual Activity  . Alcohol use: Yes    Comment: rare at the superbowl or summer picnics  . Drug use: Not on file  . Sexual activity: Not on file  Lifestyle  . Physical activity    Days per week: Not on file    Minutes per session: Not on file  . Stress: Not on file  Relationships  . Social  Herbalist on phone: Not on file    Gets together: Not on file    Attends religious service: Not on file    Active member of club or organization: Not on file    Attends meetings of clubs or organizations: Not on file    Relationship status: Not on file  Other Topics Concern  . Not on file  Social History Narrative  . Not on file   Allergies  Allergen Reactions  . Levofloxacin Nausea Only  . Lisinopril Other (See Comments)    Dizzy, confused, nausea, palp's and sweats  . Doxycycline Palpitations    Increased heart rate, dizziness, nausea, vomiting.    Family History  Problem Relation Age of Onset  . Benign prostatic hyperplasia Father   . Heart disease Father        mature heart disease  . Hypertension Other     Current Outpatient Medications (Endocrine & Metabolic):  .  metFORMIN (GLUCOPHAGE-XR) 500 MG 24 hr tablet, TAKE 2 TABLETS BY MOUTH DAILY WITH BREAKFAST .  predniSONE (DELTASONE) 10 MG tablet, 3 tabs by mouth per day for 3 days,2tabs per day for 3 days,1tab per day for 3 days  Current Outpatient Medications (Cardiovascular):  .  atorvastatin (LIPITOR) 80 MG  tablet, TAKE 1 TABLET(80 MG) BY MOUTH DAILY .  sildenafil (VIAGRA) 100 MG tablet, Take 0.5-1 tablets (50-100 mg total) by mouth daily as needed for erectile dysfunction.  Current Outpatient Medications (Respiratory):  .  albuterol (PROVENTIL HFA;VENTOLIN HFA) 108 (90 Base) MCG/ACT inhaler, Inhale 2 puffs into the lungs every 6 (six) hours as needed. .  cetirizine (ZYRTEC) 10 MG tablet, Take 1 tablet (10 mg total) by mouth daily. .  fluticasone (FLONASE) 50 MCG/ACT nasal spray, Place 2 sprays into the nose daily.  Current Outpatient Medications (Analgesics):  .  aspirin 81 MG EC tablet, Take 81 mg by mouth daily.   .  naproxen (EC NAPROSYN) 500 MG EC tablet, Take 1 tablet (500 mg total) by mouth 2 (two) times daily. Marland Kitchen  oxyCODONE (OXY IR/ROXICODONE) 5 MG immediate release tablet, 1-2 tabs by mouth every 6  hrs as needed for pain .  traMADol (ULTRAM) 50 MG tablet, Take 1 tablet (50 mg total) by mouth every 6 (six) hours as needed for pain.   Current Outpatient Medications (Other):  .  cyclobenzaprine (FLEXERIL) 5 MG tablet, Take 1 tablet (5 mg total) by mouth 3 (three) times daily as needed for muscle spasms. .  divalproex (DEPAKOTE) 250 MG 24 hr tablet, Take 250 mg by mouth daily.   Marland Kitchen  gabapentin (NEURONTIN) 100 MG capsule, Take 2 capsules (200 mg total) by mouth at bedtime. .  methylphenidate (RITALIN) 20 MG tablet, Take 20 mg by mouth. 1 by mouth two times a day every morning and at noon  .  ONE TOUCH ULTRA TEST test strip, USE AS DIRECTED .  sulfamethoxazole-trimethoprim (BACTRIM,SEPTRA) 400-80 MG tablet, TAKE 1 TABLET BY MOUTH TWICE DAILY .  triamcinolone (KENALOG) 0.1 % cream, Apply topically 2 (two) times daily.      Past medical history, social, surgical and family history all reviewed in electronic medical record.  No pertanent information unless stated regarding to the chief complaint.   Review of Systems:  No headache, visual changes, nausea, vomiting, diarrhea, constipation, dizziness, abdominal pain, skin rash, fevers, chills, night sweats, weight loss, swollen lymph nodes, body aches, joint swelling, muscle aches, chest pain, shortness of breath, mood changes.   Objective  There were no vitals taken for this visit.    General: No apparent distress alert and oriented x3 mood and affect normal, dressed appropriately.  HEENT: Pupils equal, extraocular movements intact  Respiratory: Patient's speak in full sentences and does not appear short of breath  Cardiovascular: No lower extremity edema, non tender, no erythema  Skin: Warm dry intact with no signs of infection or rash on extremities or on axial skeleton.  Abdomen: Soft nontender  Neuro: Cranial nerves II through XII are intact, neurovascularly intact in all extremities with 2+ DTRs and 2+ pulses.  Lymph: No lymphadenopathy  of posterior or anterior cervical chain or axillae bilaterally.  Gait normal with good balance and coordination.  MSK:  Non tender with full range of motion and good stability and symmetric strength and tone of  elbows, wrist, hip, knee and ankles bilaterally.  Shoulder: Left Inspection reveals no abnormalities, atrophy or asymmetry. Palpation is normal with no tenderness over AC joint or bicipital groove. ROM is full in all planes. Rotator cuff strength normal throughout. No signs of impingement with negative Neer and Hawkin's tests, empty can sign. Speeds and Yergason's tests normal. No labral pathology noted with negative Obrien's, negative clunk and good stability. Normal scapular function observed. No painful arc and no drop arm  sign. No apprehension sign Patient neck exam does have some mild loss of lordosis but near full range of motion with mild crepitus.  Negative Spurling's today.  In addition to this patient is having some mild tightness of the back.  Mild loss of lordosis, tightness with straight leg but no radicular symptoms.  Mild tightness of Pearlean BrownieFaber test on the left as well.   [id number]; 15 additional minutes spent for Therapeutic exercises as stated in above notes.  This included exercises focusing on stretching, strengthening, with significant focus on eccentric aspects.   Long term goals include an improvement in range of motion, strength, endurance as well as avoiding reinjury. Patient's frequency would include in 1-2 times a day, 3-5 times a week for a duration of 6-12 weeks.  Low back exercises that included:  Pelvic tilt/bracing instruction to focus on control of the pelvic girdle and lower abdominal muscles  Glute strengthening exercises, focusing on proper firing of the glutes without engaging the low back muscles Proper stretching techniques for maximum relief for the hamstrings, hip flexors, low back and some rotation where tolerated  Proper technique shown and discussed  handout in great detail with ATC.  All questions were discussed and answered.     Impression and Recommendations:     This case required medical decision making of moderate complexity. The above documentation has been reviewed and is accurate and complete Judi SaaZachary M , DO       Note: This dictation was prepared with Dragon dictation along with smaller phrase technology. Any transcriptional errors that result from this process are unintentional.

## 2018-09-17 NOTE — Assessment & Plan Note (Signed)
Doing well after the injection.  No change in management at this time.

## 2018-09-17 NOTE — Assessment & Plan Note (Signed)
Stable after flare.  No change in management

## 2018-09-18 DIAGNOSIS — Z01818 Encounter for other preprocedural examination: Secondary | ICD-10-CM | POA: Diagnosis not present

## 2018-12-20 ENCOUNTER — Ambulatory Visit: Payer: BC Managed Care – PPO | Admitting: Family Medicine

## 2019-02-13 ENCOUNTER — Ambulatory Visit: Payer: BC Managed Care – PPO | Admitting: Family Medicine

## 2019-03-31 ENCOUNTER — Ambulatory Visit: Payer: Self-pay | Attending: Internal Medicine

## 2019-03-31 DIAGNOSIS — Z23 Encounter for immunization: Secondary | ICD-10-CM

## 2019-03-31 NOTE — Progress Notes (Signed)
   Covid-19 Vaccination Clinic  Name:  Jonathan Heath    MRN: [id number] DOB: [birth year]  03/31/2019  Jonathan Heath was observed post Covid-19 immunization for 15 minutes without incident. He was provided with Vaccine Information Sheet and instruction to access the V-Safe system.   Jonathan Heath was instructed to call 911 with any severe reactions post vaccine: Marland Kitchen Difficulty breathing  . Swelling of face and throat  . A fast heartbeat  . A bad rash all over body  . Dizziness and weakness   Immunizations Administered    Name Date Dose VIS Date Route   Pfizer COVID-19 Vaccine 03/31/2019  1:58 PM 0.3 mL 12/27/2018 Intramuscular   Manufacturer: [employer], [employer]   Lot: [id number]   NDC: [address]-3

## 2019-04-16 ENCOUNTER — Ambulatory Visit: Payer: 59 | Admitting: Family Medicine

## 2019-04-16 ENCOUNTER — Other Ambulatory Visit: Payer: Self-pay

## 2019-04-16 ENCOUNTER — Encounter: Payer: Self-pay | Admitting: Family Medicine

## 2019-04-16 VITALS — BP 130/80 | HR 101 | Ht 69.0 in | Wt 178.0 lb

## 2019-04-16 DIAGNOSIS — M5030 Other cervical disc degeneration, unspecified cervical region: Secondary | ICD-10-CM | POA: Diagnosis not present

## 2019-04-16 DIAGNOSIS — M25519 Pain in unspecified shoulder: Secondary | ICD-10-CM | POA: Diagnosis not present

## 2019-04-16 DIAGNOSIS — G8929 Other chronic pain: Secondary | ICD-10-CM

## 2019-04-16 NOTE — Progress Notes (Signed)
[facility] [address] [place] [address] Phone: [phone] Subjective:   I Jonathan Heath am serving as a Neurosurgeon for Dr. Antoine Primas.  This visit occurred during the SARS-CoV-2 public health emergency.  Safety protocols were in place, including screening questions prior to the visit, additional usage of staff PPE, and extensive cleaning of exam room while observing appropriate contact time as indicated for disinfecting solutions.   I'm seeing this patient by the request  of:  Corwin Levins, MD  CC: Neck pain and upper back pain follow-up  OHY:[id number]   09/17/2018 History of radicular symptoms.  Some more tightness on the left side with straight leg test but no radicular symptoms at this moment.  Discussed with patient about icing regimen, core strengthening, stability exercises.  Patient is to increase activity as tolerated.  Follow-up with me again in 4 to 8 weeks  Stable after flare.  No change in management  Update 04/16/2019 Jonathan Heath is a 64 y.o. male coming in with complaint of back pain. Patient states he has been exercising. Wants to get back to more challenging exercises. 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 Patient has occasional pain but overall doing well.      Past Medical History:  Diagnosis Date  . ADHD   . DISC DISEASE, LUMBAR   . DM2 (diabetes mellitus, type 2) ([facility])   . ERECTILE DYSFUNCTION   . FRACTURE, RIGHT LEG   . HYPERLIPIDEMIA   . Hypertension    Past Surgical History:  Procedure Laterality Date  . hx fx right leg s/p ORIF     Social History   Socioeconomic History  . Marital status: Married    Spouse name: Not on file  . Number of children: Not on file  . Years of education: Not on file  . Highest education level: Not on file  Occupational History  . Occupation: Museum/gallery exhibitions officer: [employer]  Tobacco Use  . Smoking status: Never Smoker  . Smokeless tobacco: Never Used  Substance and Sexual Activity  . Alcohol use: Yes    Comment: rare at the superbowl or summer picnics  . Drug use: Not on file  . Sexual activity: Not on file  Other Topics Concern  . Not on file  Social History Narrative  . Not on file   Social Determinants of Health   Financial Resource Strain:   . Difficulty of Paying Living Expenses:   Food Insecurity:   . Worried About Programme researcher, broadcasting/film/video in the Last Year:   . Barista in the Last Year:   Transportation Needs:   . Freight forwarder (Medical):   Marland Kitchen Lack of Transportation (Non-Medical):   Physical Activity:   . Days of Exercise per Week:   . Minutes of Exercise per Session:   Stress:   . Feeling of Stress :   Social Connections:   . Frequency of Communication with Friends and Family:   . Frequency of Social Gatherings with Friends and Family:   . Attends Religious Services:   . Active Member of Clubs or Organizations:   . Attends Banker Meetings:   Marland Kitchen Marital Status:    Allergies  Allergen Reactions  . Levofloxacin Nausea Only  . Lisinopril Other (See Comments)    Dizzy, confused, nausea, palp's and sweats  .  Doxycycline Palpitations    Increased heart rate, dizziness, nausea, vomiting.    Family History  Problem Relation Age of Onset  . Benign prostatic hyperplasia Father   . Heart disease Father        mature heart disease  . Hypertension Other     Current Outpatient Medications (Endocrine & Metabolic):  .  metFORMIN (GLUCOPHAGE-XR) 500 MG 24 hr tablet, TAKE 2 TABLETS BY MOUTH DAILY WITH BREAKFAST .  predniSONE (DELTASONE) 10 MG tablet, 3 tabs by mouth per day for 3 days,2tabs per day for 3 days,1tab per day for 3 days  Current Outpatient Medications (Cardiovascular):  .  atorvastatin (LIPITOR) 80 MG tablet, TAKE 1 TABLET(80 MG) BY MOUTH DAILY .  sildenafil (VIAGRA) 100 MG tablet, Take 0.5-1  tablets (50-100 mg total) by mouth daily as needed for erectile dysfunction.  Current Outpatient Medications (Respiratory):  .  albuterol (PROVENTIL HFA;VENTOLIN HFA) 108 (90 Base) MCG/ACT inhaler, Inhale 2 puffs into the lungs every 6 (six) hours as needed. .  cetirizine (ZYRTEC) 10 MG tablet, Take 1 tablet (10 mg total) by mouth daily. .  fluticasone (FLONASE) 50 MCG/ACT nasal spray, Place 2 sprays into the nose daily.  Current Outpatient Medications (Analgesics):  .  aspirin 81 MG EC tablet, Take 81 mg by mouth daily.   .  naproxen (EC NAPROSYN) 500 MG EC tablet, Take 1 tablet (500 mg total) by mouth 2 (two) times daily. Marland Kitchen  oxyCODONE (OXY IR/ROXICODONE) 5 MG immediate release tablet, 1-2 tabs by mouth every 6 hrs as needed for pain .  traMADol (ULTRAM) 50 MG tablet, Take 1 tablet (50 mg total) by mouth every 6 (six) hours as needed for pain.   Current Outpatient Medications (Other):  .  cyclobenzaprine (FLEXERIL) 5 MG tablet, Take 1 tablet (5 mg total) by mouth 3 (three) times daily as needed for muscle spasms. .  divalproex (DEPAKOTE) 250 MG 24 hr tablet, Take 250 mg by mouth daily.   Marland Kitchen  gabapentin (NEURONTIN) 100 MG capsule, Take 2 capsules (200 mg total) by mouth at bedtime. .  methylphenidate (RITALIN) 20 MG tablet, Take 20 mg by mouth. 1 by mouth two times a day every morning and at noon  .  ONE TOUCH ULTRA TEST test strip, USE AS DIRECTED .  sulfamethoxazole-trimethoprim (BACTRIM,SEPTRA) 400-80 MG tablet, TAKE 1 TABLET BY MOUTH TWICE DAILY .  triamcinolone (KENALOG) 0.1 % cream, Apply topically 2 (two) times daily.     Reviewed prior external information including notes and imaging from  primary care provider As well as notes that were available from care everywhere and other healthcare systems.  Past medical history, social, surgical and family history all reviewed in electronic medical record.  No pertanent information unless stated regarding to the chief complaint.   Review  of Systems:  No headache, visual changes, nausea, vomiting, diarrhea, constipation, dizziness, abdominal pain, skin rash, fevers, chills, night sweats, weight loss, swollen lymph nodes, body aches, joint swelling, chest pain, shortness of breath, mood changes. POSITIVE muscle aches  Objective  Blood pressure 130/80, pulse (!) 101, height 5\' 9"  (1.753 m), weight 178 lb (80.7 kg), SpO2 98 %.   General: No apparent distress alert and oriented x3 mood and affect normal, dressed appropriately.  HEENT: Pupils equal, extraocular movements intact  Respiratory: Patient's speak in full sentences and does not appear short of breath  Cardiovascular: No lower extremity edema, non tender, no erythema  Neuro: Cranial nerves II through XII are intact, neurovascularly intact in all  extremities with 2+ DTRs and 2+ pulses.  Gait normal with good balance and coordination.  MSK:  Non tender with full range of motion and good stability and symmetric strength and tone of shoulders, elbows, wrist, hip, knee and ankles bilaterally.  Neck exam shows the patient does have some very minimal loss of lordosis.  Patient has 0555 strength of the upper extremities.  Full range of motion of the shoulders noted.  Very mild winging noted of the inferior aspect of the scapulas bilaterally.   Impression and Recommendations:      The above documentation has been reviewed and is accurate and complete Judi Saa, DO       Note: This dictation was prepared with Dragon dictation along with smaller phrase technology. Any transcriptional errors that result from this process are unintentional.

## 2019-04-16 NOTE — Patient Instructions (Addendum)
Good to see you Keep doing exercises Gabapetin as needed  See me again in 2 months

## 2019-04-16 NOTE — Assessment & Plan Note (Signed)
Stable but not increasing strength  Discussed PT  COntinue gabapentin  RTC in 2 months

## 2019-04-22 ENCOUNTER — Ambulatory Visit: Payer: 59 | Attending: Internal Medicine

## 2019-04-22 ENCOUNTER — Ambulatory Visit: Payer: Self-pay

## 2019-04-22 DIAGNOSIS — Z23 Encounter for immunization: Secondary | ICD-10-CM

## 2019-04-22 NOTE — Progress Notes (Signed)
   Covid-19 Vaccination Clinic  Name:  Kiet Geer    MRN: [id number] DOB: 1955-05-21  04/22/2019  Mr. Wallen was observed post Covid-19 immunization for 15 minutes without incident. He was provided with Vaccine Information Sheet and instruction to access the V-Safe system.   Mr. Radler was instructed to call 911 with any severe reactions post vaccine: Marland Kitchen Difficulty breathing  . Swelling of face and throat  . A fast heartbeat  . A bad rash all over body  . Dizziness and weakness   Immunizations Administered    Name Date Dose VIS Date Route   Pfizer COVID-19 Vaccine 04/22/2019  4:57 PM 0.3 mL 12/27/2018 Intramuscular   Manufacturer: [employer], [employer]   Lot: [id number]   NDC: [address]-9

## 2019-06-17 ENCOUNTER — Ambulatory Visit: Payer: 59 | Admitting: Family Medicine

## 2019-09-05 ENCOUNTER — Ambulatory Visit: Payer: Self-pay | Admitting: Family Medicine

## 2019-10-24 ENCOUNTER — Ambulatory Visit: Payer: Self-pay | Admitting: Family Medicine

## 2019-11-01 ENCOUNTER — Ambulatory Visit: Payer: Self-pay | Attending: Internal Medicine

## 2019-11-01 DIAGNOSIS — Z23 Encounter for immunization: Secondary | ICD-10-CM

## 2019-11-01 NOTE — Progress Notes (Signed)
   Covid-19 Vaccination Clinic  Name:  Jonathan Heath    MRN: [id number] DOB: 1955-05-30  11/01/2019  [name] was observed post Covid-19 immunization for 15 minutes without incident. He was provided with Vaccine Information Sheet and instruction to access the V-Safe system.   [name] was instructed to call 911 with any severe reactions post vaccine: Marland Kitchen Difficulty breathing  . Swelling of face and throat  . A fast heartbeat  . A bad rash all over body  . Dizziness and weakness

## 2020-02-24 IMAGING — DX CERVICAL SPINE - 2-3 VIEW
3 series · 3 of 3 positions shown · non-contrast
Comparison: September 11, 2013

CLINICAL DATA: Cervicalgia with right upper extremity radicular
symptoms

EXAM:
CERVICAL SPINE - 2-3 VIEW

[c-spine lat]
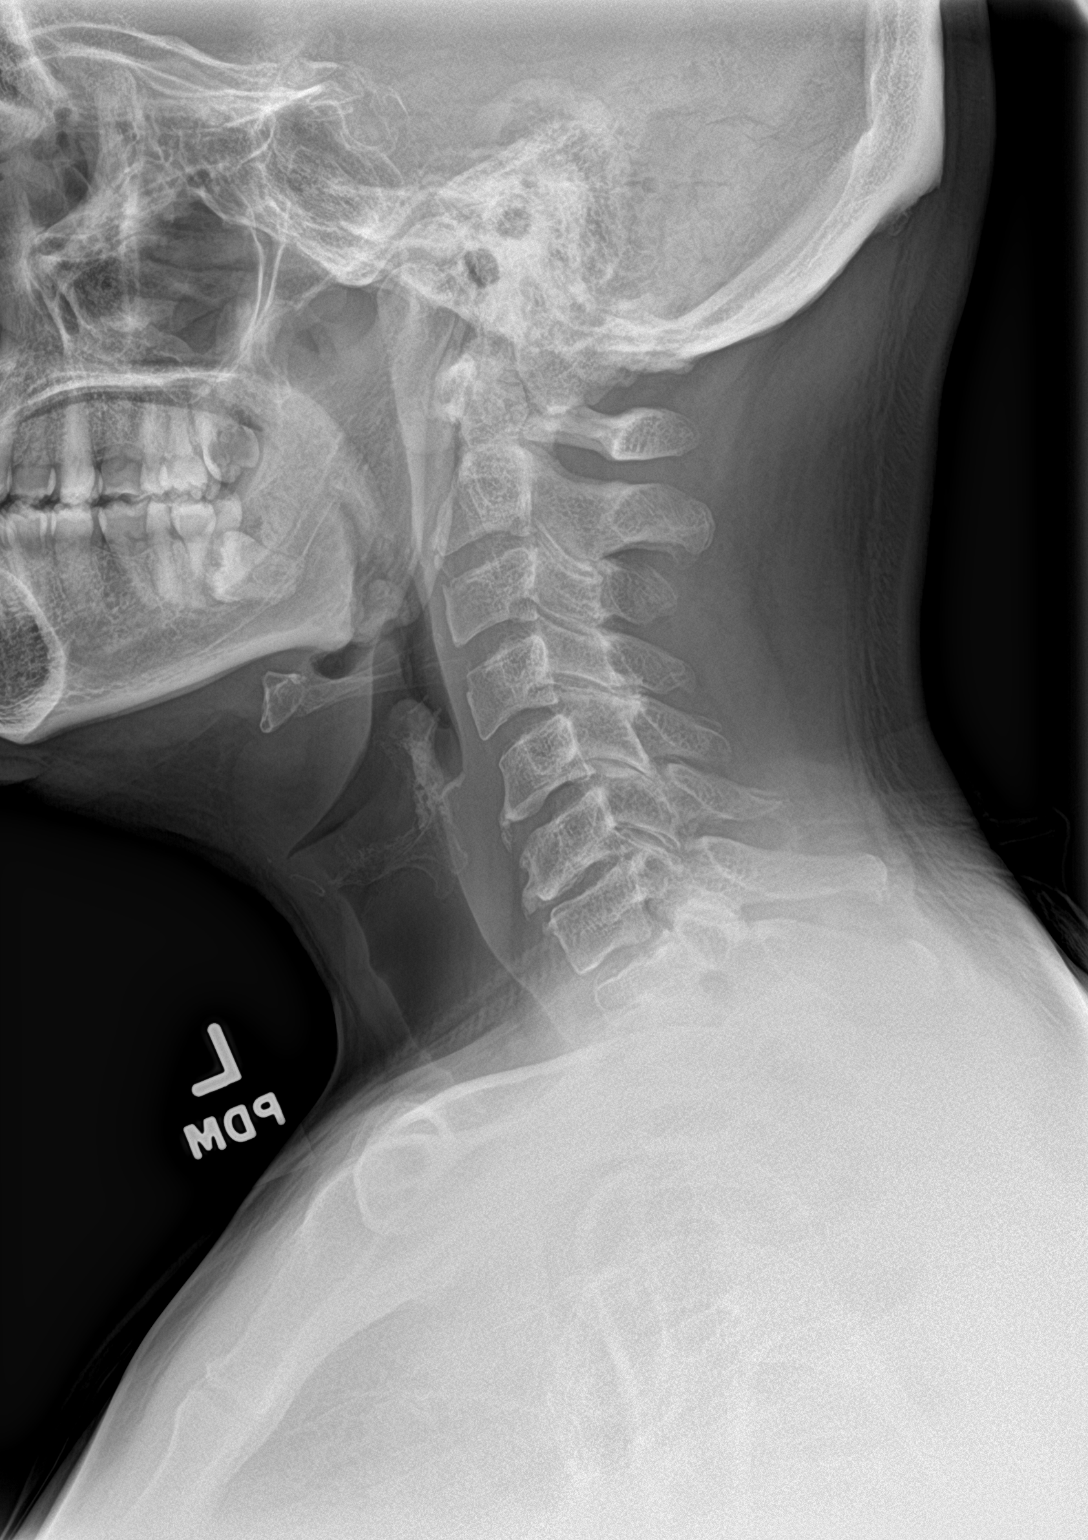

[c-spine ap]
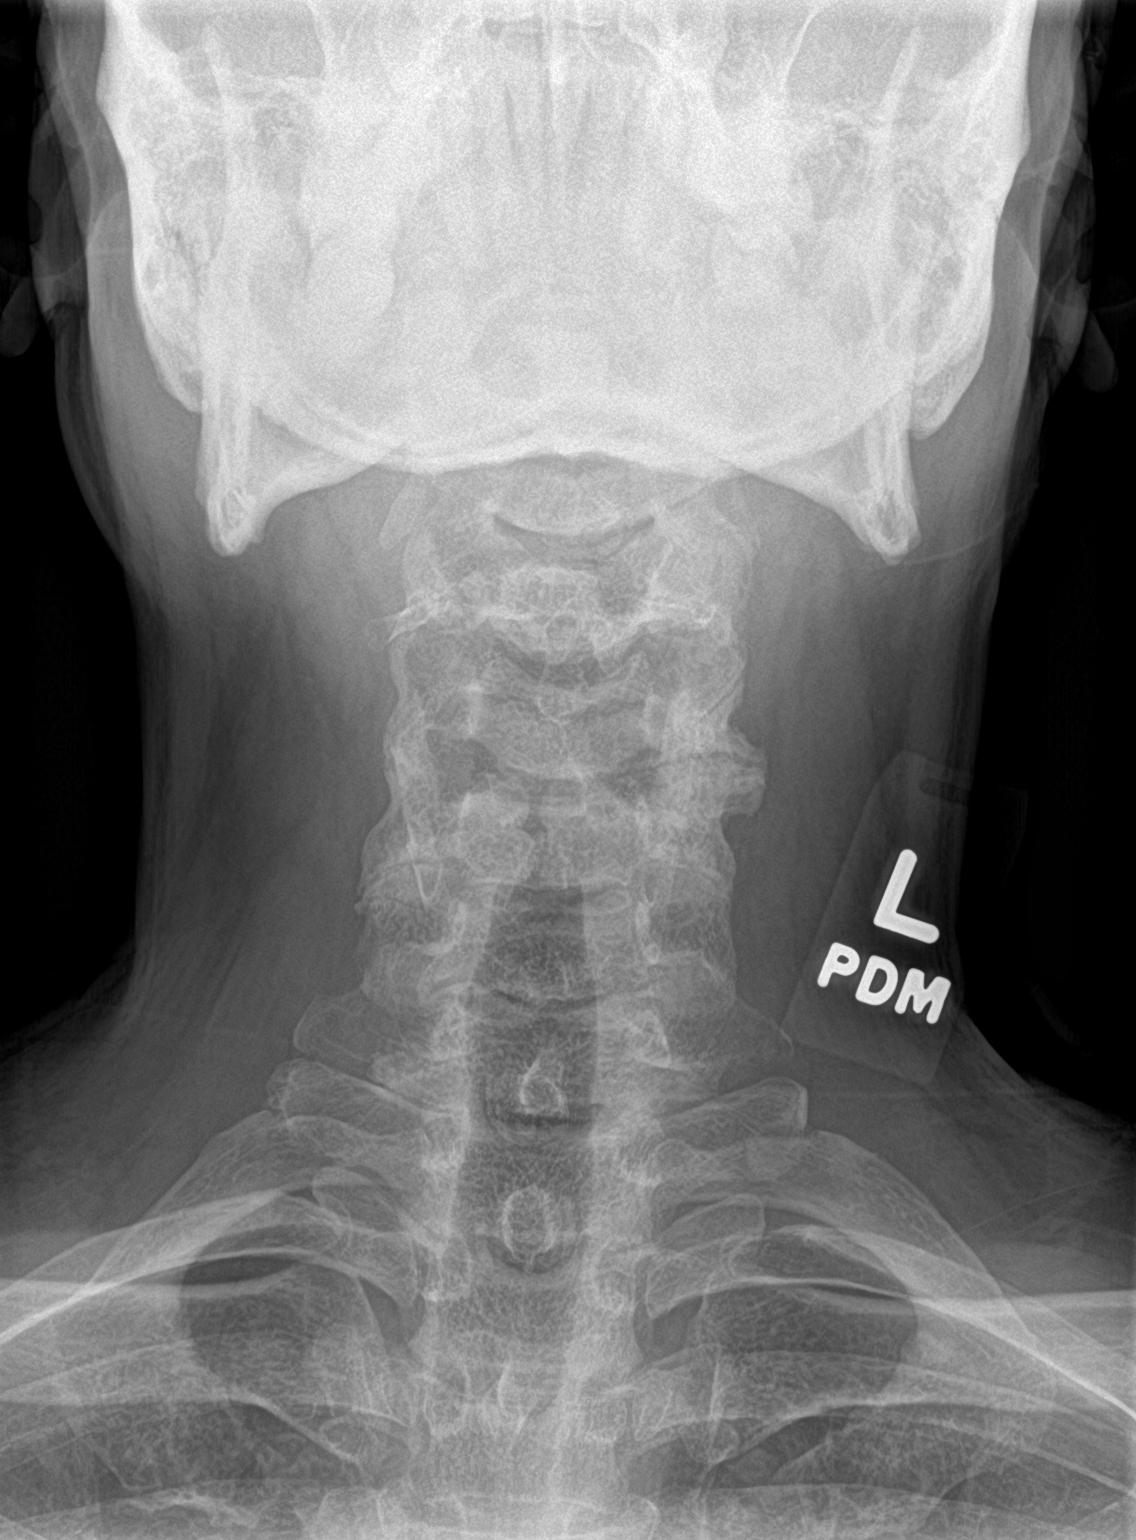

[c-spine open mouth]
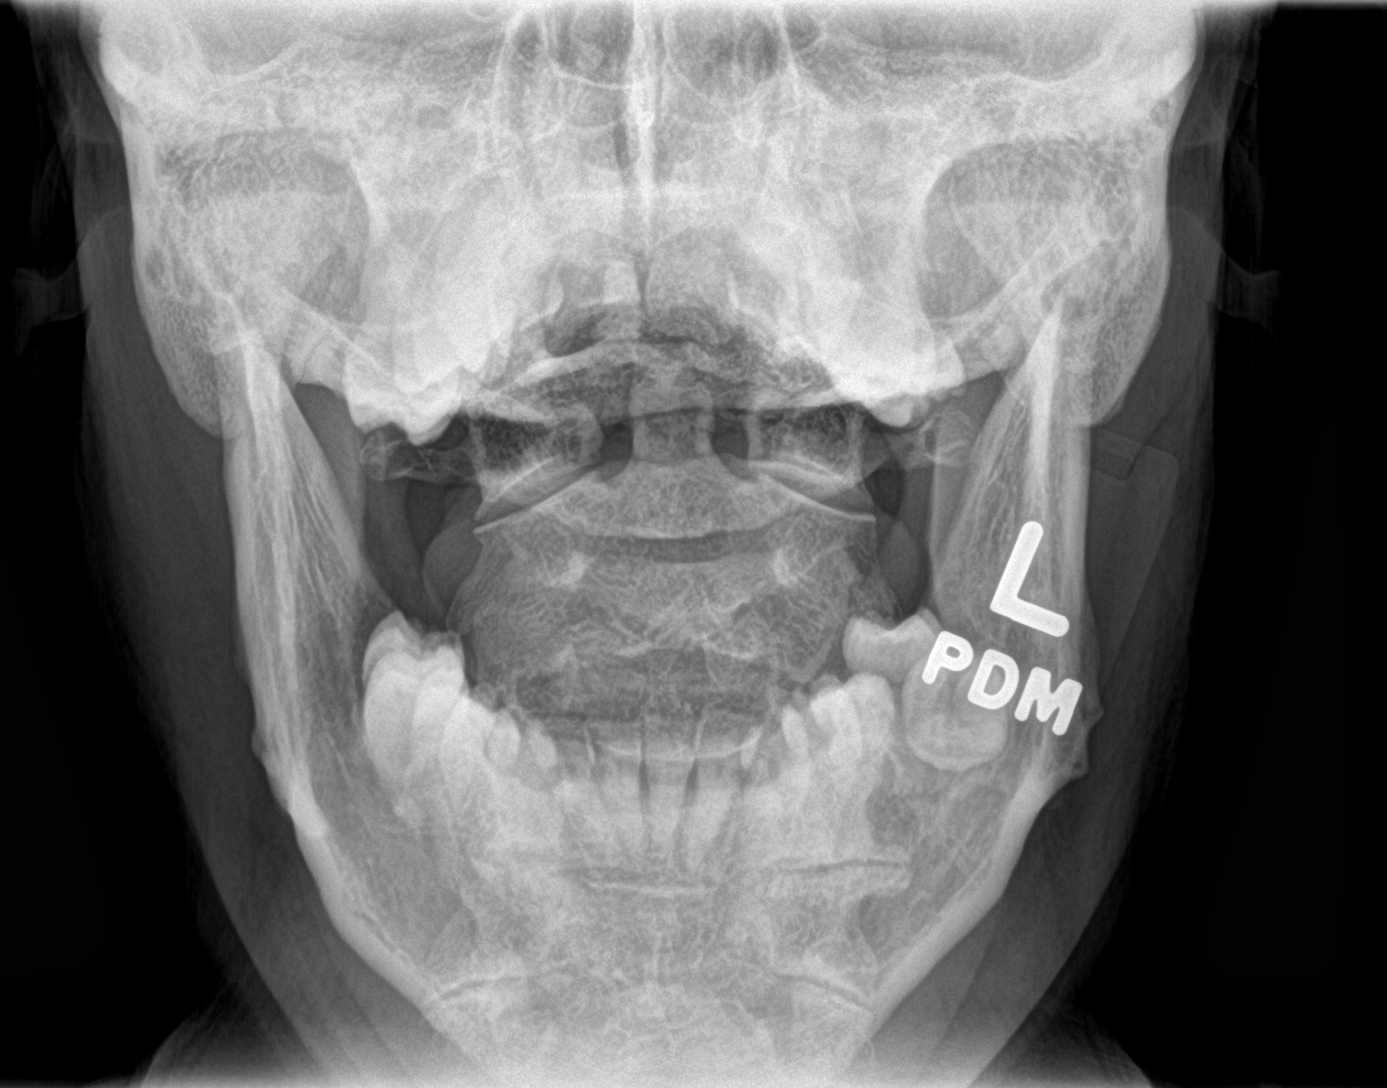

[3 of 3 positions shown; findings below may reference images not displayed]

FINDINGS: Frontal, lateral, and swimmer's views were obtained. There is no
fracture or spondylolisthesis. Prevertebral soft tissues and
predental space regions are normal. There is moderately severe disc
space narrowing at C6-7 with moderate disc space narrowing at C5-6.
There is milder disc space narrowing at C7-T1. There are anterior
osteophytes at C5 and C6 as well as to a lesser extent at C7. No
erosive changes. Lung apices are clear.
IMPRESSION: Osteoarthritic change, most notably at C6-7, with slight progression
from prior study. No fracture or spondylolisthesis.

## 2023-07-09 DIAGNOSIS — Z1211 Encounter for screening for malignant neoplasm of colon: Secondary | ICD-10-CM | POA: Diagnosis not present

## 2023-07-09 DIAGNOSIS — E785 Hyperlipidemia, unspecified: Secondary | ICD-10-CM | POA: Diagnosis not present

## 2023-07-09 DIAGNOSIS — E1169 Type 2 diabetes mellitus with other specified complication: Secondary | ICD-10-CM | POA: Diagnosis not present

## 2023-07-09 DIAGNOSIS — I10 Essential (primary) hypertension: Secondary | ICD-10-CM | POA: Diagnosis not present

## 2023-07-09 DIAGNOSIS — Z0000 Encounter for general adult medical examination without abnormal findings: Secondary | ICD-10-CM | POA: Diagnosis not present

## 2023-07-09 DIAGNOSIS — H906 Mixed conductive and sensorineural hearing loss, bilateral: Secondary | ICD-10-CM | POA: Diagnosis not present

## 2023-07-09 DIAGNOSIS — Z13220 Encounter for screening for lipoid disorders: Secondary | ICD-10-CM | POA: Diagnosis not present

## 2023-08-08 ENCOUNTER — Ambulatory Visit: Payer: Self-pay | Admitting: Family Medicine

## 2023-08-08 NOTE — Progress Notes (Deleted)
 [name] [address] [place] [address] Phone: [phone] Subjective:    I'm seeing this patient by the request  of:  No primary care provider on file.  CC:   YEP:Dlagzrupcz  Jonathan Heath is a 68 y.o. male coming in with complaint of R shoulder pain. Last seen in 2021. Patient states       Past Medical History:  Diagnosis Date   ADHD    DISC DISEASE, LUMBAR    DM2 (diabetes mellitus, type 2) (HCC)    ERECTILE DYSFUNCTION    FRACTURE, RIGHT LEG    HYPERLIPIDEMIA    Hypertension    Past Surgical History:  Procedure Laterality Date   hx fx right leg s/p ORIF     Social History   Socioeconomic History   Marital status: Married    Spouse name: Not on file   Number of children: Not on file   Years of education: Not on file   Highest education level: Not on file  Occupational History   Occupation: Freight forwarder    Employer: [employer]  Tobacco Use   Smoking status: Never   Smokeless tobacco: Never  Substance and Sexual Activity   Alcohol use: Yes    Comment: rare at the superbowl or summer picnics   Drug use: Not on file   Sexual activity: Not on file  Other Topics Concern   Not on file  Social History Narrative   Not on file   Social Drivers of Health   Financial Resource Strain: Not on file  Food Insecurity: Not on file  Transportation Needs: Not on file  Physical Activity: Not on file  Stress: Not on file  Social Connections: Not on file   Allergies  Allergen Reactions   Levofloxacin  Nausea Only   Lisinopril  Other (See Comments)    Dizzy, confused, nausea, palp's and sweats   Doxycycline  Palpitations    Increased heart rate, dizziness, nausea, vomiting.    Family History  Problem Relation Age of Onset   Benign prostatic hyperplasia Father    Heart disease Father        mature heart disease   Hypertension Other     Current Outpatient Medications (Endocrine &  Metabolic):    metFORMIN  (GLUCOPHAGE -XR) 500 MG 24 hr tablet, TAKE 2 TABLETS BY MOUTH DAILY WITH BREAKFAST   predniSONE  (DELTASONE ) 10 MG tablet, 3 tabs by mouth per day for 3 days,2tabs per day for 3 days,1tab per day for 3 days  Current Outpatient Medications (Cardiovascular):    atorvastatin  (LIPITOR) 80 MG tablet, TAKE 1 TABLET(80 MG) BY MOUTH DAILY   sildenafil  (VIAGRA ) 100 MG tablet, Take 0.5-1 tablets (50-100 mg total) by mouth daily as needed for erectile dysfunction.  Current Outpatient Medications (Respiratory):    albuterol  (PROVENTIL  HFA;VENTOLIN  HFA) 108 (90 Base) MCG/ACT inhaler, Inhale 2 puffs into the lungs every 6 (six) hours as needed.   cetirizine  (ZYRTEC ) 10 MG tablet, Take 1 tablet (10 mg total) by mouth daily.   fluticasone  (FLONASE ) 50 MCG/ACT nasal spray, Place 2 sprays into the nose daily.  Current Outpatient Medications (Analgesics):    aspirin 81 MG EC tablet, Take 81 mg by mouth daily.     naproxen  (EC NAPROSYN ) 500 MG EC tablet, Take 1 tablet (500 mg total) by mouth 2 (two) times daily.   oxyCODONE  (OXY IR/ROXICODONE ) 5 MG immediate release tablet, 1-2 tabs by mouth every 6 hrs as needed for pain   traMADol  (  ULTRAM ) 50 MG tablet, Take 1 tablet (50 mg total) by mouth every 6 (six) hours as needed for pain.   Current Outpatient Medications (Other):    cyclobenzaprine  (FLEXERIL ) 5 MG tablet, Take 1 tablet (5 mg total) by mouth 3 (three) times daily as needed for muscle spasms.   divalproex (DEPAKOTE) 250 MG 24 hr tablet, Take 250 mg by mouth daily.     gabapentin  (NEURONTIN ) 100 MG capsule, Take 2 capsules (200 mg total) by mouth at bedtime.   methylphenidate (RITALIN) 20 MG tablet, Take 20 mg by mouth. 1 by mouth two times a day every morning and at noon    ONE TOUCH ULTRA TEST test strip, USE AS DIRECTED   sulfamethoxazole -trimethoprim  (BACTRIM ,SEPTRA ) 400-80 MG tablet, TAKE 1 TABLET BY MOUTH TWICE DAILY   triamcinolone  (KENALOG ) 0.1 % cream, Apply topically 2  (two) times daily.     Reviewed prior external information including notes and imaging from  primary care provider As well as notes that were available from care everywhere and other healthcare systems.  Past medical history, social, surgical and family history all reviewed in electronic medical record.  No pertanent information unless stated regarding to the chief complaint.   Review of Systems:  No headache, visual changes, nausea, vomiting, diarrhea, constipation, dizziness, abdominal pain, skin rash, fevers, chills, night sweats, weight loss, swollen lymph nodes, body aches, joint swelling, chest pain, shortness of breath, mood changes. POSITIVE muscle aches  Objective  There were no vitals taken for this visit.   General: No apparent distress alert and oriented x3 mood and affect normal, dressed appropriately.  HEENT: Pupils equal, extraocular movements intact  Respiratory: Patient's speak in full sentences and does not appear short of breath  Cardiovascular: No lower extremity edema, non tender, no erythema      Impression and Recommendations:

## 2023-08-10 ENCOUNTER — Ambulatory Visit: Payer: Self-pay | Admitting: Family Medicine

## 2023-08-20 DIAGNOSIS — I10 Essential (primary) hypertension: Secondary | ICD-10-CM | POA: Diagnosis not present

## 2023-08-20 DIAGNOSIS — E1165 Type 2 diabetes mellitus with hyperglycemia: Secondary | ICD-10-CM | POA: Diagnosis not present

## 2023-10-22 DIAGNOSIS — E1169 Type 2 diabetes mellitus with other specified complication: Secondary | ICD-10-CM | POA: Diagnosis not present

## 2023-10-22 DIAGNOSIS — M25511 Pain in right shoulder: Secondary | ICD-10-CM | POA: Diagnosis not present

## 2023-10-22 DIAGNOSIS — I10 Essential (primary) hypertension: Secondary | ICD-10-CM | POA: Diagnosis not present

## 2023-10-22 DIAGNOSIS — T887XXA Unspecified adverse effect of drug or medicament, initial encounter: Secondary | ICD-10-CM | POA: Diagnosis not present

## 2023-10-24 NOTE — Progress Notes (Deleted)
 Jonathan Heath [address] [place] [address] Phone: [phone] Subjective:    I'm seeing this patient by the request  of:  No primary care provider on file.  CC: right shoulder pain   YEP:Dlagzrupcz  March 2021 Stable but not increasing strength  Discussed PT  COntinue gabapentin   RTC in 2 months      Update 10/29/2023 Jonathan Heath is a 68 y.o. male coming in with complaint of C-spine and R shoulder pain. Patient states     Cervical xrays in 07/2018 showed moderate DDD 6-7 worse  Shoulder injection in 2020 as well  Past Medical History:  Diagnosis Date   ADHD    DISC DISEASE, LUMBAR    DM2 (diabetes mellitus, type 2) (HCC)    ERECTILE DYSFUNCTION    FRACTURE, RIGHT LEG    HYPERLIPIDEMIA    Hypertension    Past Surgical History:  Procedure Laterality Date   hx fx right leg s/p ORIF     Social History   Socioeconomic History   Marital status: Married    Spouse name: Not on file   Number of children: Not on file   Years of education: Not on file   Highest education level: Not on file  Occupational History   Occupation: Freight forwarder    Employer: [employer]  Tobacco Use   Smoking status: Never   Smokeless tobacco: Never  Substance and Sexual Activity   Alcohol use: Yes    Comment: rare at the superbowl or summer picnics   Drug use: Not on file   Sexual activity: Not on file  Other Topics Concern   Not on file  Social History Narrative   Not on file   Social Drivers of Health   Financial Resource Strain: Not on file  Food Insecurity: Not on file  Transportation Needs: Not on file  Physical Activity: Not on file  Stress: Not on file  Social Connections: Not on file   Allergies  Allergen Reactions   Levofloxacin  Nausea Only   Lisinopril  Other (See Comments)    Dizzy, confused, nausea, palp's and sweats   Doxycycline  Palpitations    Increased heart rate, dizziness, nausea,  vomiting.    Family History  Problem Relation Age of Onset   Benign prostatic hyperplasia Father    Heart disease Father        mature heart disease   Hypertension Other     Current Outpatient Medications (Endocrine & Metabolic):    metFORMIN  (GLUCOPHAGE -XR) 500 MG 24 hr tablet, TAKE 2 TABLETS BY MOUTH DAILY WITH BREAKFAST   predniSONE  (DELTASONE ) 10 MG tablet, 3 tabs by mouth per day for 3 days,2tabs per day for 3 days,1tab per day for 3 days  Current Outpatient Medications (Cardiovascular):    atorvastatin  (LIPITOR) 80 MG tablet, TAKE 1 TABLET(80 MG) BY MOUTH DAILY   sildenafil  (VIAGRA ) 100 MG tablet, Take 0.5-1 tablets (50-100 mg total) by mouth daily as needed for erectile dysfunction.  Current Outpatient Medications (Respiratory):    albuterol  (PROVENTIL  HFA;VENTOLIN  HFA) 108 (90 Base) MCG/ACT inhaler, Inhale 2 puffs into the lungs every 6 (six) hours as needed.   cetirizine  (ZYRTEC ) 10 MG tablet, Take 1 tablet (10 mg total) by mouth daily.   fluticasone  (FLONASE ) 50 MCG/ACT nasal spray, Place 2 sprays into the nose daily.  Current Outpatient Medications (Analgesics):    aspirin 81 MG EC tablet, Take 81 mg by mouth daily.     naproxen  (EC  NAPROSYN ) 500 MG EC tablet, Take 1 tablet (500 mg total) by mouth 2 (two) times daily.   oxyCODONE  (OXY IR/ROXICODONE ) 5 MG immediate release tablet, 1-2 tabs by mouth every 6 hrs as needed for pain   traMADol  (ULTRAM ) 50 MG tablet, Take 1 tablet (50 mg total) by mouth every 6 (six) hours as needed for pain.   Current Outpatient Medications (Other):    cyclobenzaprine  (FLEXERIL ) 5 MG tablet, Take 1 tablet (5 mg total) by mouth 3 (three) times daily as needed for muscle spasms.   divalproex (DEPAKOTE) 250 MG 24 hr tablet, Take 250 mg by mouth daily.     gabapentin  (NEURONTIN ) 100 MG capsule, Take 2 capsules (200 mg total) by mouth at bedtime.   methylphenidate (RITALIN) 20 MG tablet, Take 20 mg by mouth. 1 by mouth two times a day every morning  and at noon    ONE TOUCH ULTRA TEST test strip, USE AS DIRECTED   sulfamethoxazole -trimethoprim  (BACTRIM ,SEPTRA ) 400-80 MG tablet, TAKE 1 TABLET BY MOUTH TWICE DAILY   triamcinolone  (KENALOG ) 0.1 % cream, Apply topically 2 (two) times daily.     Reviewed prior external information including notes and imaging from  primary care provider As well as notes that were available from care everywhere and other healthcare systems.  Past medical history, social, surgical and family history all reviewed in electronic medical record.  No pertanent information unless stated regarding to the chief complaint.   Review of Systems:  No headache, visual changes, nausea, vomiting, diarrhea, constipation, dizziness, abdominal pain, skin rash, fevers, chills, night sweats, weight loss, swollen lymph nodes, body aches, joint swelling, chest pain, shortness of breath, mood changes. POSITIVE muscle aches  Objective  There were no vitals taken for this visit.   General: No apparent distress alert and oriented x3 mood and affect normal, dressed appropriately.  HEENT: Pupils equal, extraocular movements intact  Respiratory: Patient's speak in full sentences and does not appear short of breath  Cardiovascular: No lower extremity edema, non tender, no erythema  Right shoulder exam shows  Neck exam shows  Limited muscular skeletal ultrasound was performed and interpreted by CLAUDENE HUSSAR, M     Impression and Recommendations:    The above documentation has been reviewed and is accurate and complete [name], DO

## 2023-10-29 ENCOUNTER — Ambulatory Visit: Admitting: Family Medicine

## 2023-11-01 NOTE — Progress Notes (Signed)
 Jonathan Heath [facility] [address] [place] [address] Phone: [phone] Subjective:   Jonathan Heath am a scribe for Dr. Claudene.   I'm seeing this patient by the request  of:  No primary care provider on file. Patient states that doctor switched his Ridlin medication Adderall 30 mg. For the last year having a lot of pain constantly. Can't sleep on it. He has numbness and tingling with burning sensation down to this fingers. It feels like needle sticks in the palms of his hands. Pain is alleviated temporary after working out. But, it comes back with a fury. Taking Advil. There is also tension and tightness in his next in his neck. There is a dull pain in his neck but it feels more like muscle than bone.     CC: right shoulder pain   YEP:Dlagzrupcz  March 2021 Stable but not increasing strength  Discussed PT  COntinue gabapentin   RTC in 2 months      Update 11/05/2023 Jonathan Heath is a 68 y.o. male coming in with complaint of C-spine and R shoulder pain. Patient states     Cervical xrays in 07/2018 showed moderate DDD 6-7 worse  Shoulder injection in 2020 as well  Past Medical History:  Diagnosis Date   ADHD    DISC DISEASE, LUMBAR    DM2 (diabetes mellitus, type 2) (HCC)    ERECTILE DYSFUNCTION    FRACTURE, RIGHT LEG    HYPERLIPIDEMIA    Hypertension    Past Surgical History:  Procedure Laterality Date   hx fx right leg s/p ORIF     Social History   Socioeconomic History   Marital status: Married    Spouse name: Not on file   Number of children: Not on file   Years of education: Not on file   Highest education level: Not on file  Occupational History   Occupation: Freight forwarder    Employer: [employer]  Tobacco Use   Smoking status: Never   Smokeless tobacco: Never  Substance and Sexual Activity   Alcohol use: Yes    Comment: rare at the superbowl or summer picnics   Drug use: Not on file   Sexual  activity: Not on file  Other Topics Concern   Not on file  Social History Narrative   Not on file   Social Drivers of Health   Financial Resource Strain: Not on file  Food Insecurity: Not on file  Transportation Needs: Not on file  Physical Activity: Not on file  Stress: Not on file  Social Connections: Not on file   Allergies  Allergen Reactions   Levofloxacin  Nausea Only   Lisinopril  Other (See Comments)    Dizzy, confused, nausea, palp's and sweats   Doxycycline  Palpitations    Increased heart rate, dizziness, nausea, vomiting.    Family History  Problem Relation Age of Onset   Benign prostatic hyperplasia Father    Heart disease Father        mature heart disease   Hypertension Other     Current Outpatient Medications (Endocrine & Metabolic):    metFORMIN  (GLUCOPHAGE -XR) 500 MG 24 hr tablet, TAKE 2 TABLETS BY MOUTH DAILY WITH BREAKFAST   predniSONE  (DELTASONE ) 10 MG tablet, 3 tabs by mouth per day for 3 days,2tabs per day for 3 days,1tab per day for 3 days  Current Outpatient Medications (Cardiovascular):    atorvastatin  (LIPITOR) 80 MG tablet, TAKE 1 TABLET(80 MG) BY MOUTH DAILY  sildenafil  (VIAGRA ) 100 MG tablet, Take 0.5-1 tablets (50-100 mg total) by mouth daily as needed for erectile dysfunction.  Current Outpatient Medications (Respiratory):    albuterol  (PROVENTIL  HFA;VENTOLIN  HFA) 108 (90 Base) MCG/ACT inhaler, Inhale 2 puffs into the lungs every 6 (six) hours as needed.   cetirizine  (ZYRTEC ) 10 MG tablet, Take 1 tablet (10 mg total) by mouth daily.   fluticasone  (FLONASE ) 50 MCG/ACT nasal spray, Place 2 sprays into the nose daily.  Current Outpatient Medications (Analgesics):    aspirin 81 MG EC tablet, Take 81 mg by mouth daily.     naproxen  (EC NAPROSYN ) 500 MG EC tablet, Take 1 tablet (500 mg total) by mouth 2 (two) times daily.   oxyCODONE  (OXY IR/ROXICODONE ) 5 MG immediate release tablet, 1-2 tabs by mouth every 6 hrs as needed for pain   traMADol   (ULTRAM ) 50 MG tablet, Take 1 tablet (50 mg total) by mouth every 6 (six) hours as needed for pain.   Current Outpatient Medications (Other):    cyclobenzaprine  (FLEXERIL ) 5 MG tablet, Take 1 tablet (5 mg total) by mouth 3 (three) times daily as needed for muscle spasms.   divalproex (DEPAKOTE) 250 MG 24 hr tablet, Take 250 mg by mouth daily.     gabapentin  (NEURONTIN ) 100 MG capsule, Take 2 capsules (200 mg total) by mouth at bedtime.   methylphenidate (RITALIN) 20 MG tablet, Take 20 mg by mouth. 1 by mouth two times a day every morning and at noon    ONE TOUCH ULTRA TEST test strip, USE AS DIRECTED   sulfamethoxazole -trimethoprim  (BACTRIM ,SEPTRA ) 400-80 MG tablet, TAKE 1 TABLET BY MOUTH TWICE DAILY   triamcinolone  (KENALOG ) 0.1 % cream, Apply topically 2 (two) times daily.     Reviewed prior external information including notes and imaging from  primary care provider As well as notes that were available from care everywhere and other healthcare systems.  Past medical history, social, surgical and family history all reviewed in electronic medical record.  No pertanent information unless stated regarding to the chief complaint.   Review of Systems:  No headache, visual changes, nausea, vomiting, diarrhea, constipation, dizziness, abdominal pain, skin rash, fevers, chills, night sweats, weight loss, swollen lymph nodes, body aches, joint swelling, chest pain, shortness of breath, mood changes. POSITIVE muscle aches  Objective  Blood pressure (!) 140/70, pulse 90, height 5' 9 (1.753 m), weight 180 lb (81.6 kg), SpO2 97%.    General: No apparent distress alert and oriented x3 mood and affect normal, dressed appropriately.  HEENT: Pupils equal, extraocular movements intact  Respiratory: Patient's speak in full sentences and does not appear short of breath  Cardiovascular: No lower extremity edema, non tender, no erythema  Right shoulder exam shows relatively good range of motion.  Mild  weakness of the rotator cuff compared to the contralateral side.  Seems to be more in the wrist itself. Neck exam shows significant loss of lordosis noted.  Some tightness noted in the paraspinal musculature.  Positive radicular symptoms in the C8 distribution.  Hypothenar eminence wasting noted but bilaterally.  Weakness noted bilaterally      Impression and Recommendations:    The above documentation has been reviewed and is accurate and complete Alexcis Bicking M Corbin Hott, DO

## 2023-11-05 ENCOUNTER — Other Ambulatory Visit: Payer: Self-pay

## 2023-11-05 ENCOUNTER — Ambulatory Visit: Admitting: Family Medicine

## 2023-11-05 VITALS — BP 140/70 | HR 90 | Ht 69.0 in | Wt 180.0 lb

## 2023-11-05 DIAGNOSIS — M5030 Other cervical disc degeneration, unspecified cervical region: Secondary | ICD-10-CM

## 2023-11-05 DIAGNOSIS — M542 Cervicalgia: Secondary | ICD-10-CM

## 2023-11-05 DIAGNOSIS — M25511 Pain in right shoulder: Secondary | ICD-10-CM | POA: Diagnosis not present

## 2023-11-05 MED ORDER — GABAPENTIN 100 MG PO CAPS: 200.0000 mg | ORAL_CAPSULE | Freq: Every day | ORAL | 0 refills | Status: DC

## 2023-11-05 NOTE — Assessment & Plan Note (Signed)
 No severe cervical disc disease.  Unfortunately having a positive radicular symptoms in the C8 distribution.  Weakness though noted at the C8 distribution bilaterally with hypothenar eminence wasting noted.  Deep tendon reflexes likely intact.  Very nervous today patient is having worsening spinal stenosis.  Patient does do a lot of manual labor.  He has good effect his job as well.  At this point do feel x-rays and MRI is necessary.  Started on gabapentin .  Depending on findings could be a good candidate for possible epidural injections.  Follow-up after imaging to discuss further treatment

## 2023-11-05 NOTE — Patient Instructions (Addendum)
 Good to see you. X-rays on the way out.  [facility] [phone] Call Today  When we receive your results we will contact you.

## 2023-11-06 ENCOUNTER — Ambulatory Visit
Admission: RE | Admit: 2023-11-06 | Discharge: 2023-11-06 | Disposition: A | Source: Ambulatory Visit | Attending: Family Medicine | Admitting: Family Medicine

## 2023-11-06 DIAGNOSIS — M5020 Other cervical disc displacement, unspecified cervical region: Secondary | ICD-10-CM | POA: Diagnosis not present

## 2023-11-06 DIAGNOSIS — M47812 Spondylosis without myelopathy or radiculopathy, cervical region: Secondary | ICD-10-CM | POA: Diagnosis not present

## 2023-11-06 DIAGNOSIS — M4802 Spinal stenosis, cervical region: Secondary | ICD-10-CM | POA: Diagnosis not present

## 2023-11-06 DIAGNOSIS — M542 Cervicalgia: Secondary | ICD-10-CM

## 2023-11-07 ENCOUNTER — Other Ambulatory Visit: Payer: Self-pay

## 2023-11-07 ENCOUNTER — Ambulatory Visit: Payer: Self-pay | Admitting: Family Medicine

## 2023-11-07 DIAGNOSIS — M542 Cervicalgia: Secondary | ICD-10-CM

## 2023-11-10 ENCOUNTER — Other Ambulatory Visit

## 2023-11-22 ENCOUNTER — Other Ambulatory Visit

## 2023-11-30 ENCOUNTER — Ambulatory Visit: Payer: Self-pay | Admitting: Family Medicine

## 2024-02-13 ENCOUNTER — Other Ambulatory Visit: Payer: Self-pay | Admitting: Family Medicine
